# Patient Record
Sex: Male | Born: 1975 | Race: White | Hispanic: No | Marital: Married | Smoking: Never smoker
Health system: Southern US, Community
[De-identification: ages and names within clinical notes are randomized; demographics above are authoritative.]

## PROBLEM LIST (undated history)

## (undated) DIAGNOSIS — M109 Gout, unspecified: Secondary | ICD-10-CM

## (undated) HISTORY — PX: NO PAST SURGERIES: SHX2092

---

## 2008-11-20 ENCOUNTER — Emergency Department: Payer: Self-pay | Admitting: Emergency Medicine

## 2008-12-04 DIAGNOSIS — M1A9XX0 Chronic gout, unspecified, without tophus (tophi): Secondary | ICD-10-CM

## 2008-12-04 DIAGNOSIS — M1A00X0 Idiopathic chronic gout, unspecified site, without tophus (tophi): Secondary | ICD-10-CM

## 2008-12-04 HISTORY — DX: Idiopathic chronic gout, unspecified site, without tophus (tophi): M1A.00X0

## 2008-12-04 HISTORY — DX: Chronic gout, unspecified, without tophus (tophi): M1A.9XX0

## 2009-05-27 ENCOUNTER — Emergency Department: Payer: Self-pay | Admitting: Internal Medicine

## 2009-06-03 ENCOUNTER — Emergency Department: Payer: Self-pay | Admitting: Internal Medicine

## 2013-02-04 ENCOUNTER — Emergency Department: Payer: Self-pay | Admitting: Emergency Medicine

## 2013-02-14 ENCOUNTER — Emergency Department: Payer: Self-pay | Admitting: Emergency Medicine

## 2013-03-17 ENCOUNTER — Emergency Department: Payer: Self-pay | Admitting: Emergency Medicine

## 2013-03-17 LAB — BASIC METABOLIC PANEL
Anion Gap: 5 — ABNORMAL LOW (ref 7–16)
Chloride: 103 mmol/L (ref 98–107)
Creatinine: 1.27 mg/dL (ref 0.60–1.30)
EGFR (African American): >60
Glucose: 102 mg/dL — ABNORMAL HIGH (ref 65–99)
Osmolality: 276 (ref 275–301)
Potassium: 3.9 mmol/L (ref 3.5–5.1)
Sodium: 137 mmol/L (ref 136–145)

## 2013-03-17 LAB — URIC ACID: Uric Acid: 8.9 mg/dL — ABNORMAL HIGH (ref 3.5–7.2)

## 2013-03-17 LAB — CBC
HCT: 43.4 % (ref 40.0–52.0)
HGB: 15.1 g/dL (ref 13.0–18.0)
MCHC: 34.8 g/dL (ref 32.0–36.0)
RBC: 5.26 10*6/uL (ref 4.40–5.90)
RDW: 13.5 % (ref 11.5–14.5)

## 2014-03-27 DIAGNOSIS — M1009 Idiopathic gout, multiple sites: Secondary | ICD-10-CM

## 2014-03-27 HISTORY — DX: Idiopathic gout, multiple sites: M10.09

## 2014-03-27 LAB — CBC AND DIFFERENTIAL
HEMATOCRIT: 46 % (ref 41–53)
Hemoglobin: 15.5 g/dL (ref 13.5–17.5)
NEUTROS ABS: 18 /uL
Platelets: 294 10*3/uL (ref 150–399)
WBC: 20.9 10*3/mL

## 2014-03-27 LAB — BASIC METABOLIC PANEL
BUN: 21 mg/dL (ref 4–21)
Creatinine: 1.1 mg/dL (ref 0.6–1.3)
GLUCOSE: 228 mg/dL
POTASSIUM: 4.2 mmol/L (ref 3.4–5.3)
SODIUM: 145 mmol/L (ref 137–147)

## 2014-03-27 LAB — HEPATIC FUNCTION PANEL
ALT: 59 U/L — AB (ref 10–40)
AST: 27 U/L (ref 14–40)

## 2015-01-24 IMAGING — CR DG SHOULDER 3+V*R*
1 series · 3 of 3 positions shown · non-contrast
Comparison: None

REASON FOR EXAM: R shoulder pain
COMMENTS:

PROCEDURE:     DXR - DXR SHOULDER RIGHT COMPLETE  - February 04, 2013 [DATE]
RESULT:     History: Right shoulder pain

[Series 1: w shoulder external right · 0.14mm/px · 3 of 3 slices shown]
[im 1/3]
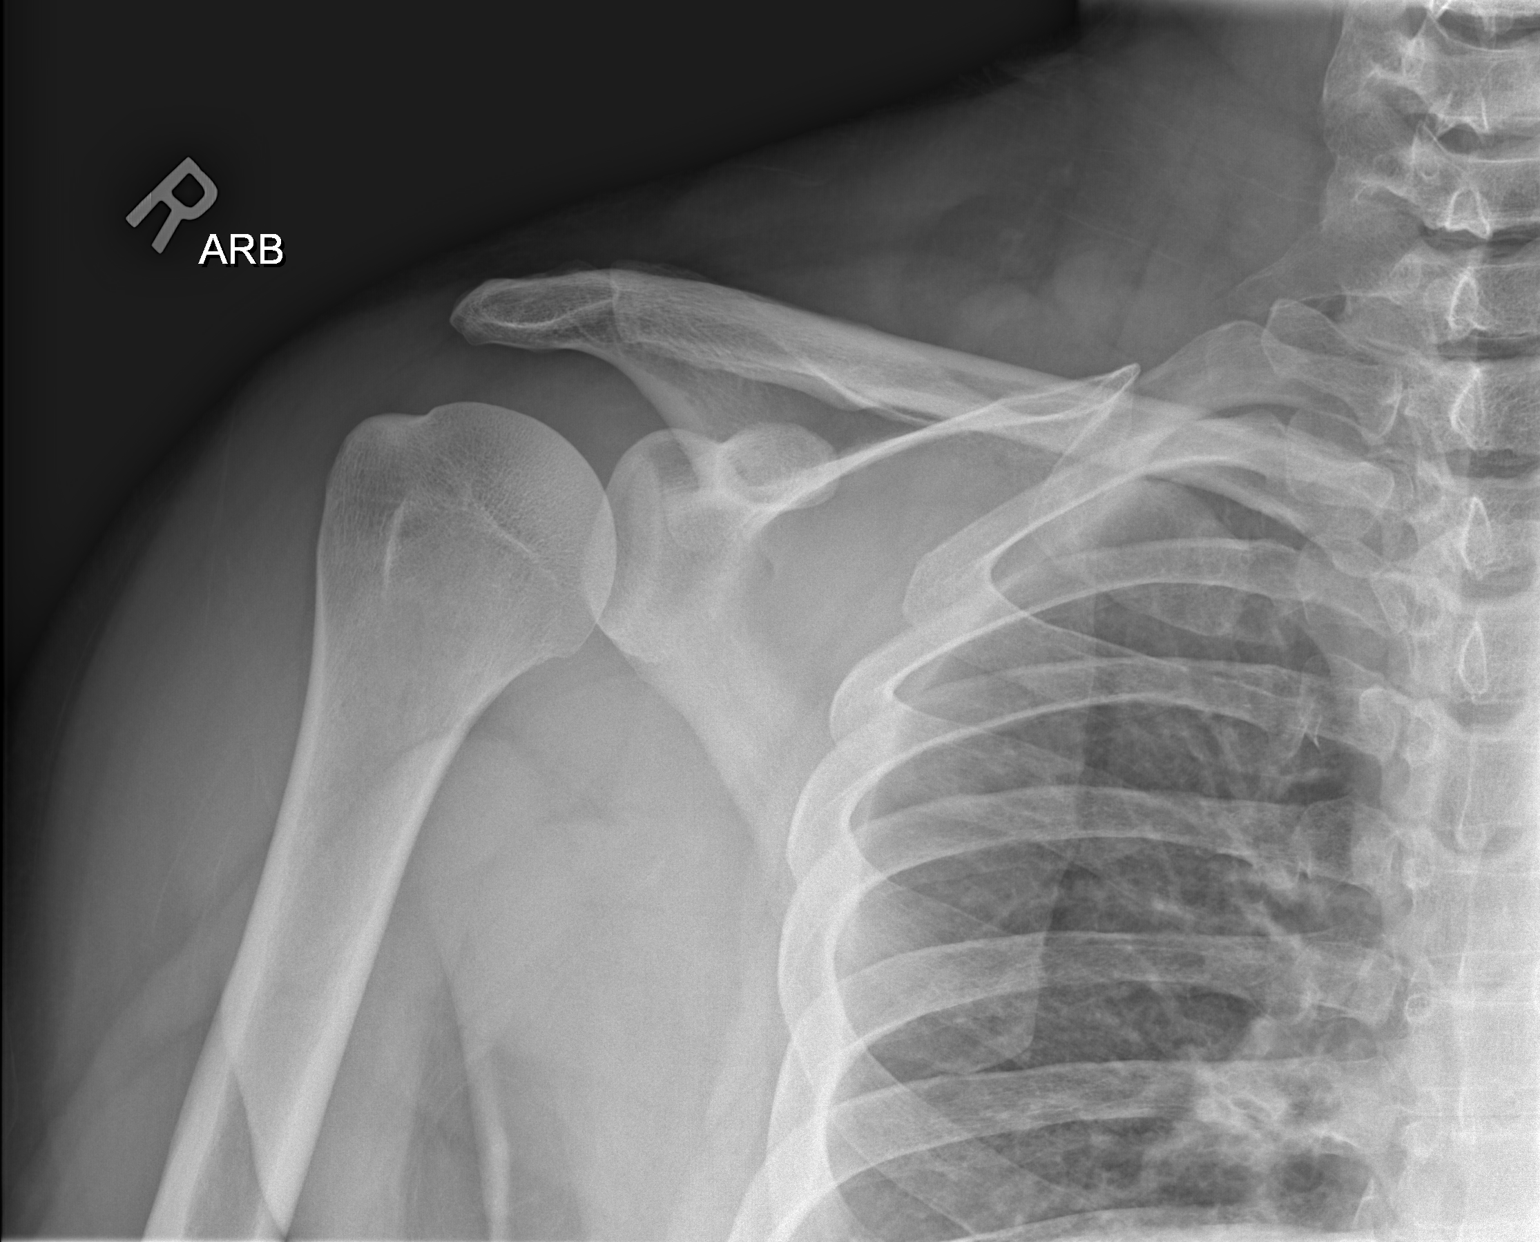
[im 2/3]
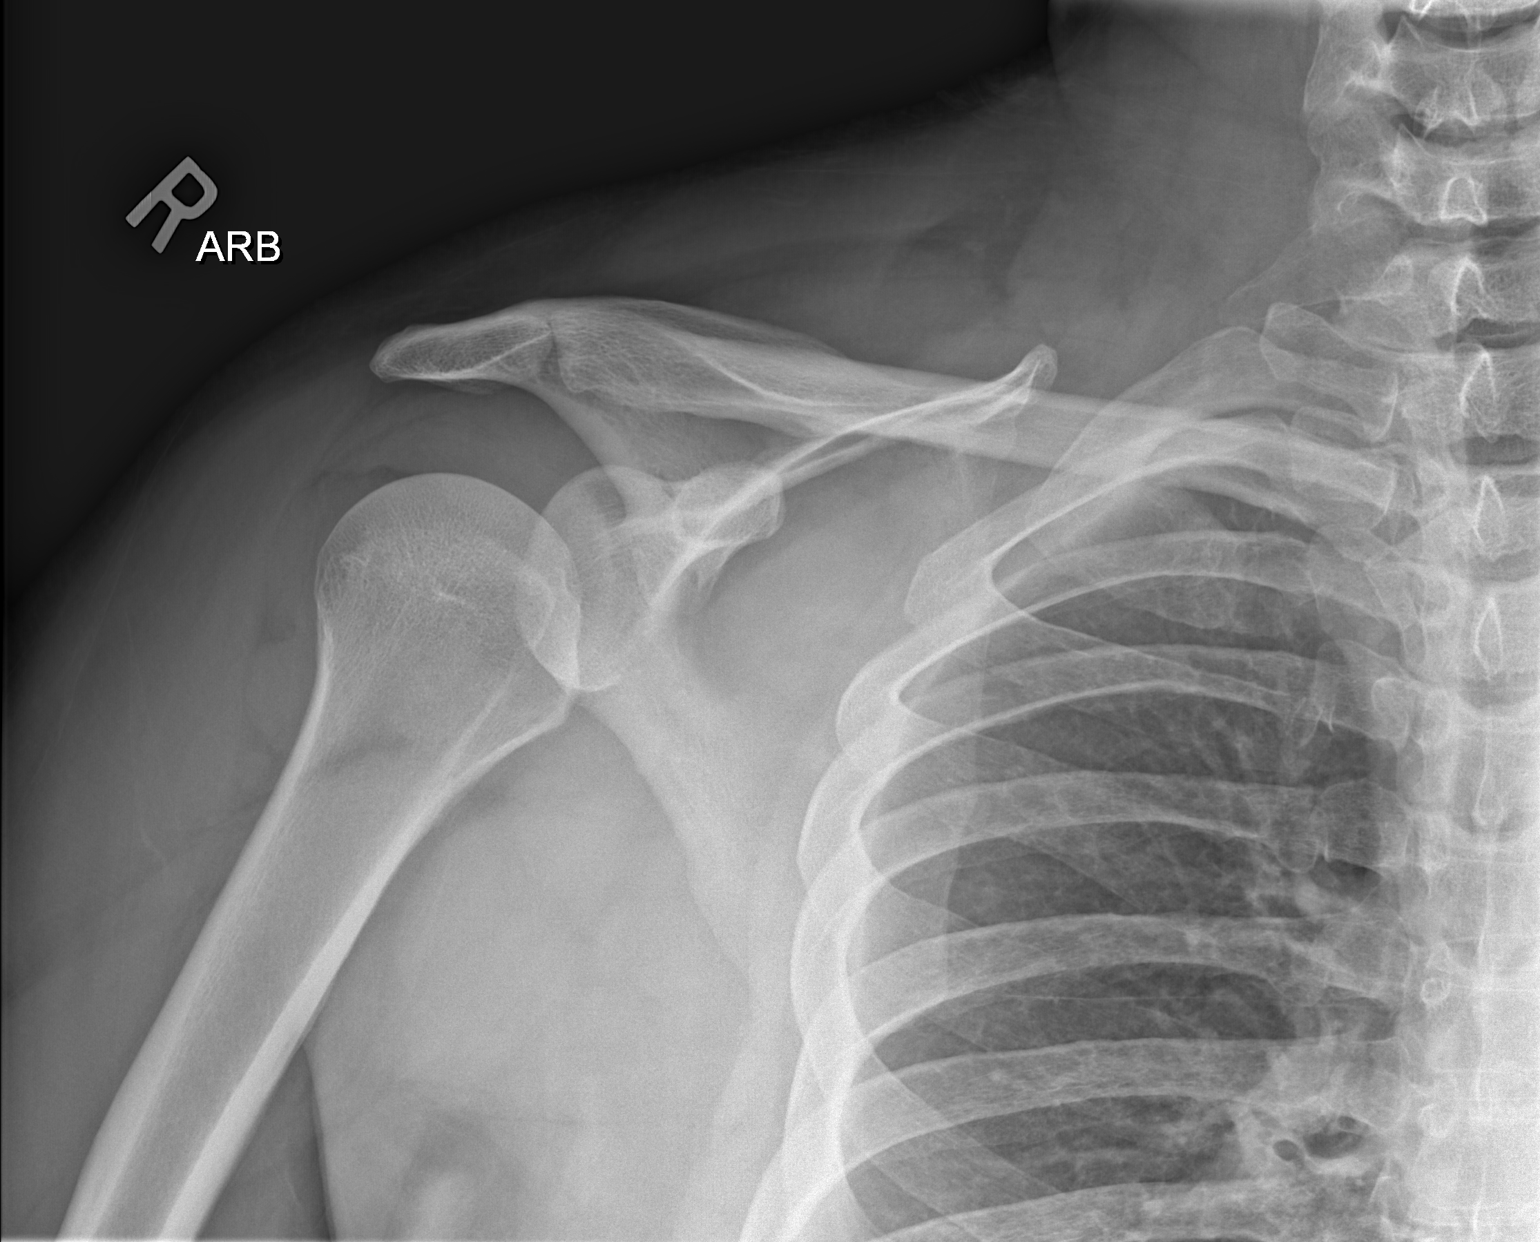
[im 3/3]
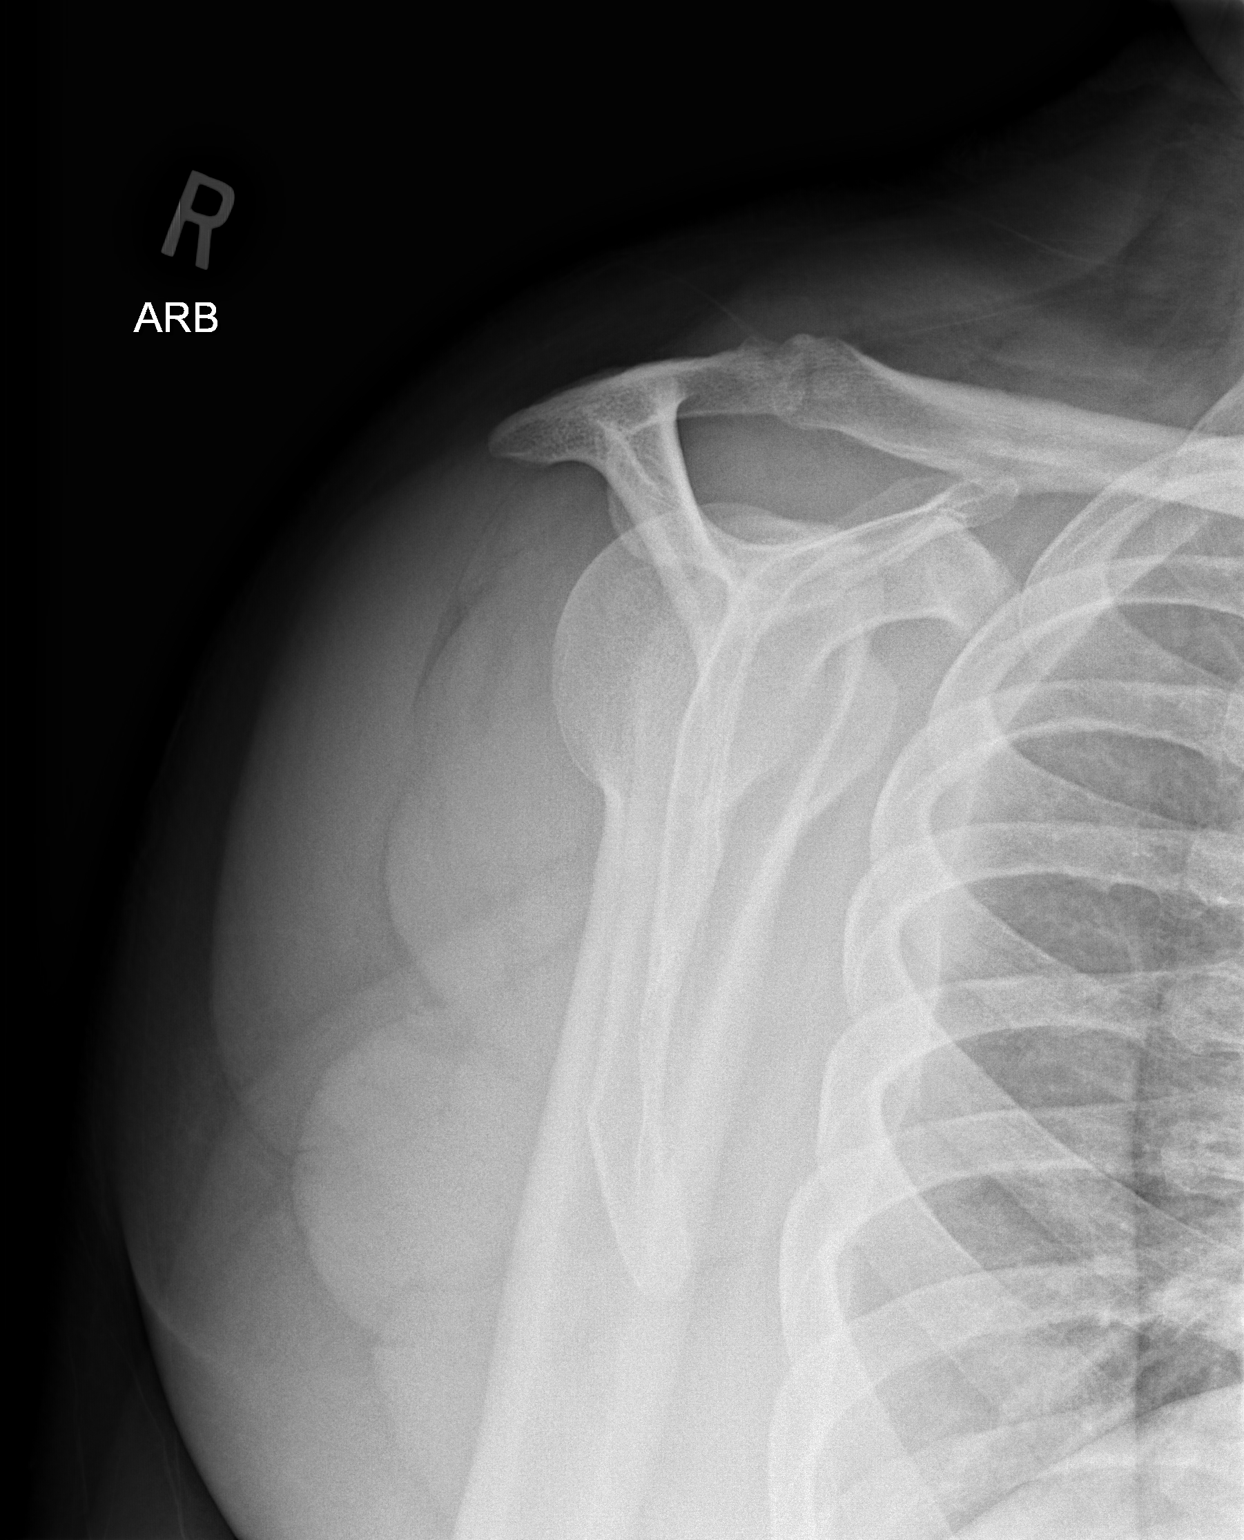

[3 of 3 positions shown; findings below may reference images not displayed]

FINDINGS: 3 views of the right shoulder demonstrates no fracture or dislocation. The
acromioclavicular joint is normal.
IMPRESSION: No acute osseous injury of the right shoulder.

[REDACTED]

## 2015-03-02 DIAGNOSIS — E668 Other obesity: Secondary | ICD-10-CM | POA: Insufficient documentation

## 2015-03-02 DIAGNOSIS — E669 Obesity, unspecified: Secondary | ICD-10-CM

## 2015-03-02 DIAGNOSIS — M109 Gout, unspecified: Secondary | ICD-10-CM | POA: Insufficient documentation

## 2015-03-02 DIAGNOSIS — K219 Gastro-esophageal reflux disease without esophagitis: Secondary | ICD-10-CM

## 2015-03-02 HISTORY — DX: Obesity, unspecified: E66.9

## 2015-03-02 HISTORY — DX: Gout, unspecified: M10.9

## 2015-03-02 HISTORY — DX: Gastro-esophageal reflux disease without esophagitis: K21.9

## 2015-11-09 ENCOUNTER — Ambulatory Visit (INDEPENDENT_AMBULATORY_CARE_PROVIDER_SITE_OTHER): Payer: Medicaid Other | Admitting: Family Medicine

## 2015-11-09 ENCOUNTER — Encounter: Payer: Self-pay | Admitting: Family Medicine

## 2015-11-09 DIAGNOSIS — G4730 Sleep apnea, unspecified: Secondary | ICD-10-CM | POA: Diagnosis not present

## 2015-11-09 DIAGNOSIS — K219 Gastro-esophageal reflux disease without esophagitis: Secondary | ICD-10-CM | POA: Diagnosis not present

## 2015-11-09 DIAGNOSIS — R609 Edema, unspecified: Secondary | ICD-10-CM | POA: Diagnosis not present

## 2015-11-09 DIAGNOSIS — M1009 Idiopathic gout, multiple sites: Secondary | ICD-10-CM

## 2015-11-09 DIAGNOSIS — M1A00X0 Idiopathic chronic gout, unspecified site, without tophus (tophi): Secondary | ICD-10-CM

## 2015-11-09 DIAGNOSIS — M109 Gout, unspecified: Secondary | ICD-10-CM

## 2015-11-09 NOTE — Progress Notes (Signed)
Patient ID: Anthony Mitchell, male   DOB: 1976-03-07, 40 y.o.   MRN: [id number]       Patient: Anthony Mitchell Male    DOB: Mar 03, 1976   39 y.o.   MRN: [id number] Visit Date: 11/09/2015  Today's Provider: Dortha Kern, PA   Chief Complaint  Patient presents with  . Gout  . Snoring  . Edema   Subjective:    HPI  Patient is here for follow up. Last visit was in April 2016. Gout: Patient has changed his diet to vegetarian diet. His symptoms have improved some. Patient was on Prednisone for 7 years at least once a month for gout but has been off of this medication for 6 months now. He has been taking Ibuprofen for the pain. He is still following rheumatologist for this issue.  Edema: patient is concerned with swelling in his left lower leg x 2 days. Patient states he pressed that area and noticed some pitting edema. Swelling does get better over night. No swelling in the right leg. Patient denies chest pain, chest tightness or shortness of breath. He does have numbness in his foot but it is chronic and had this evaluated before.  Patient wants to get referred for another sleep study. He had this done before about 10 years ago and he tried CPAP machine but could not get use to it. His symptoms are: snoring, apnea, gasping for air at night and choking on acid at times, feeling tired all the time all day long.   No past medical history on file. Patient Active Problem List   Diagnosis Date Noted  . Acid reflux 03/02/2015  . 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 Extreme obesity (HCC) 03/02/2015  . 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 No past surgeries     Family History  Problem Relation Age of Onset  . COPD Father   . Heart disease Father   . Hypertension Father   . Healthy Sister   . Gout Brother   . Heart attack Maternal Grandfather   . Gout Maternal Grandfather   . Alcohol abuse Maternal Grandfather   . Other Mother    tachycardiac    Allergies  Allergen Reactions  . Hydrocodone-Acetaminophen     itching and rash   Previous Medications   ALLOPURINOL (ZYLOPRIM) 300 MG TABLET    Take 1 tablet by mouth 2 (two) times daily.    IBUPROFEN (ADVIL,MOTRIN) 600 MG TABLET    Take 1 tablet by mouth as needed.   MULTIPLE VITAMIN PO    Take 1 tablet by mouth daily.   OMEGA-3 FATTY ACIDS (FISH OIL) 500 MG CAPS    Take by mouth.    Review of Systems  Constitutional: Positive for fatigue.  Respiratory: Positive for apnea, choking and wheezing. Negative for cough and shortness of breath.   Cardiovascular: Positive for leg swelling. Negative for chest pain and palpitations.  Musculoskeletal: Positive for myalgias, back pain, joint swelling, arthralgias and gait problem. Negative for neck pain and neck stiffness.  Neurological: Positive for light-headedness (a couple spells at night).    Social History  Substance Use Topics  . Smoking status: Never Smoker   . Smokeless tobacco: Never Used  . Alcohol Use: No   Objective:   BP 132/98 mmHg  Pulse 80  Temp(Src) 98.6 F (37 C)  Resp 18  Wt 277 lb (125.646 kg) Body mass index is 41.5 kg/(m^2).   Physical Exam  Constitutional: He is oriented to person, place, and time. He appears well-developed and well-nourished.  Morbid obesity  HENT:  Head: Normocephalic.  Left Ear: External ear normal.  Nose: Nose normal.  Mouth/Throat: Oropharynx is clear and moist.  Eyes: Conjunctivae and EOM are normal.  Neck: Normal range of motion. Neck supple.  Cardiovascular: Normal rate, regular rhythm and normal heart sounds.   Pulmonary/Chest: Effort normal and breath sounds normal.  Abdominal: Soft. Bowel sounds are normal.  Musculoskeletal: He exhibits edema.  1+ pitting edema both lower legs to mid calf.  Neurological: He is alert and oriented to person, place, and time.  Psychiatric: He has a normal mood and affect. His behavior is normal.      Assessment & Plan:       1. Morbid obesity, unspecified obesity type (HCC) BMI >41. Having chronic gouty arthritis, GERD and sleep apnea. Suspect all secondary to morbid obesity. Will check labs for metabolic disorder (i.e. - diabetes). Encouraged to get on low calorie diet (1400-1500) and try to exercise by walking or doing water aerobic exercises. - Comprehensive metabolic panel - Lipid panel - Hemoglobin A1c - TSH  2. Chronic gouty arthropathy Presently on Ibuprofen 600 mg as needed (may go up to QID) and continue Allopurinol 300 mg qd. Recheck labs. - CBC with Differential/Platelet - Uric acid  3. Arthritis urica  4. Gastroesophageal reflux disease, esophagitis presence not specified Having some nocturnal choking and wheezing. May be related to sleep apnea and GERD. May use Zantac 150 mg BID and check routine labs. Denies any swallowing difficulties. - CBC with Differential/Platelet  5. Peripheral edema Mild venous insufficiency with morbid obesity. Elevate legs when possible, limit sodium intake and may use support hose. Check labs for possible hypothyroidism. - TSH  6. Sleep apnea Epworth sleepiness scale score 23 and history of witnessed apnea, snoring and morbid obesity. Will schedule for sleep study. - Ambulatory referral to Sleep Studies       Dortha Kernennis Fernand Sorbello, PA  [facility]

## 2015-11-09 NOTE — Patient Instructions (Signed)
Sleep Apnea  Sleep apnea is a sleep disorder characterized by abnormal pauses in breathing while you sleep. When your breathing pauses, the level of oxygen in your blood decreases. This causes you to move out of deep sleep and into light sleep. As a result, your quality of sleep is poor, and the system that carries your blood throughout your body (cardiovascular system) experiences stress. If sleep apnea remains untreated, the following conditions can develop:  High blood pressure (hypertension).  Coronary artery disease.  Inability to achieve or maintain an erection (impotence).  Impairment of your thought process (cognitive dysfunction). There are three types of sleep apnea: 1. Obstructive sleep apnea--Pauses in breathing during sleep because of a blocked airway. 2. Central sleep apnea--Pauses in breathing during sleep because the area of the brain that controls your breathing does not send the correct signals to the muscles that control breathing. 3. Mixed sleep apnea--A combination of both obstructive and central sleep apnea. RISK FACTORS The following risk factors can increase your risk of developing sleep apnea:  Being overweight.  Smoking.  Having narrow passages in your nose and throat.  Being of older age.  Being male.  Alcohol use.  Sedative and tranquilizer use.  Ethnicity. Among individuals younger than 35 years, African Americans are at increased risk of sleep apnea. SYMPTOMS   Difficulty staying asleep.  Daytime sleepiness and fatigue.  Loss of energy.  Irritability.  Loud, heavy snoring.  Morning headaches.  Trouble concentrating.  Forgetfulness.  Decreased interest in sex.  Unexplained sleepiness. DIAGNOSIS  In order to diagnose sleep apnea, your caregiver will perform a physical examination. A sleep study done in the comfort of your own home may be appropriate if you are otherwise healthy. Your caregiver may also recommend that you spend the  night in a sleep lab. In the sleep lab, several monitors record information about your heart, lungs, and brain while you sleep. Your leg and arm movements and blood oxygen level are also recorded. TREATMENT The following actions may help to resolve mild sleep apnea:  Sleeping on your side.   Using a decongestant if you have nasal congestion.   Avoiding the use of depressants, including alcohol, sedatives, and narcotics.   Losing weight and modifying your diet if you are overweight. There also are devices and treatments to help open your airway:  Oral appliances. These are custom-made mouthpieces that shift your lower jaw forward and slightly open your bite. This opens your airway.  Devices that create positive airway pressure. This positive pressure "splints" your airway open to help you breathe better during sleep. The following devices create positive airway pressure:  Continuous positive airway pressure (CPAP) device. The CPAP device creates a continuous level of air pressure with an air pump. The air is delivered to your airway through a mask while you sleep. This continuous pressure keeps your airway open.  Nasal expiratory positive airway pressure (EPAP) device. The EPAP device creates positive air pressure as you exhale. The device consists of single-use valves, which are inserted into each nostril and held in place by adhesive. The valves create very little resistance when you inhale but create much more resistance when you exhale. That increased resistance creates the positive airway pressure. This positive pressure while you exhale keeps your airway open, making it easier to breath when you inhale again.  Bilevel positive airway pressure (BPAP) device. The BPAP device is used mainly in patients with central sleep apnea. This device is similar to the CPAP device because   it also uses an air pump to deliver continuous air pressure through a mask. However, with the BPAP machine, the  pressure is set at two different levels. The pressure when you exhale is lower than the pressure when you inhale.  Surgery. Typically, surgery is only done if you cannot comply with less invasive treatments or if the less invasive treatments do not improve your condition. Surgery involves removing excess tissue in your airway to create a wider passage way.   This information is not intended to replace advice given to you by your health care provider. Make sure you discuss any questions you have with your health care provider.   Document Released: 05/30/2002 Document Revised: 06/30/2014 Document Reviewed: 10/16/2011 Elsevier Interactive Patient Education 2016 Elsevier Inc. Gout Gout is an inflammatory arthritis caused by a buildup of uric acid crystals in the joints. Uric acid is a chemical that is normally present in the blood. When the level of uric acid in the blood is too high it can form crystals that deposit in your joints and tissues. This causes joint redness, soreness, and swelling (inflammation). Repeat attacks are common. Over time, uric acid crystals can form into masses (tophi) near a joint, destroying bone and causing disfigurement. Gout is treatable and often preventable. CAUSES  The disease begins with elevated levels of uric acid in the blood. Uric acid is produced by your body when it breaks down a naturally found substance called purines. Certain foods you eat, such as meats and fish, contain high amounts of purines. Causes of an elevated uric acid level include:  Being passed down from parent to child (heredity).  Diseases that cause increased uric acid production (such as obesity, psoriasis, and certain cancers).  Excessive alcohol use.  Diet, especially diets rich in meat and seafood.  Medicines, including certain cancer-fighting medicines (chemotherapy), water pills (diuretics), and aspirin.  Chronic kidney disease. The kidneys are no longer able to remove uric acid  well.  Problems with metabolism. Conditions strongly associated with gout include: 4. Obesity. 5. High blood pressure. 6. High cholesterol. 7. Diabetes. Not everyone with elevated uric acid levels gets gout. It is not understood why some people get gout and others do not. Surgery, joint injury, and eating too much of certain foods are some of the factors that can lead to gout attacks. SYMPTOMS   An attack of gout comes on quickly. It causes intense pain with redness, swelling, and warmth in a joint.  Fever can occur.  Often, only one joint is involved. Certain joints are more commonly involved:  Base of the big toe.  Knee.  Ankle.  Wrist.  Finger. Without treatment, an attack usually goes away in a few days to weeks. Between attacks, you usually will not have symptoms, which is different from many other forms of arthritis. DIAGNOSIS  Your caregiver will suspect gout based on your symptoms and exam. In some cases, tests may be recommended. The tests may include:  Blood tests.  Urine tests.  X-rays.  Joint fluid exam. This exam requires a needle to remove fluid from the joint (arthrocentesis). Using a microscope, gout is confirmed when uric acid crystals are seen in the joint fluid. TREATMENT  There are two phases to gout treatment: treating the sudden onset (acute) attack and preventing attacks (prophylaxis).  Treatment of an Acute Attack.  Medicines are used. These include anti-inflammatory medicines or steroid medicines.  An injection of steroid medicine into the affected joint is sometimes necessary.  The painful joint  is rested. Movement can worsen the arthritis.  You may use warm or cold treatments on painful joints, depending which works best for you.  Treatment to Prevent Attacks.  If you suffer from frequent gout attacks, your caregiver may advise preventive medicine. These medicines are started after the acute attack subsides. These medicines either help  your kidneys eliminate uric acid from your body or decrease your uric acid production. You may need to stay on these medicines for a very long time.  The early phase of treatment with preventive medicine can be associated with an increase in acute gout attacks. For this reason, during the first few months of treatment, your caregiver may also advise you to take medicines usually used for acute gout treatment. Be sure you understand your caregiver's directions. Your caregiver may make several adjustments to your medicine dose before these medicines are effective.  Discuss dietary treatment with your caregiver or dietitian. Alcohol and drinks high in sugar and fructose and foods such as meat, poultry, and seafood can increase uric acid levels. Your caregiver or dietitian can advise you on drinks and foods that should be limited. HOME CARE INSTRUCTIONS   Do not take aspirin to relieve pain. This raises uric acid levels.  Only take over-the-counter or prescription medicines for pain, discomfort, or fever as directed by your caregiver.  Rest the joint as much as possible. When in bed, keep sheets and blankets off painful areas.  Keep the affected joint raised (elevated).  Apply warm or cold treatments to painful joints. Use of warm or cold treatments depends on which works best for you.  Use crutches if the painful joint is in your leg.  Drink enough fluids to keep your urine clear or pale yellow. This helps your body get rid of uric acid. Limit alcohol, sugary drinks, and fructose drinks.  Follow your dietary instructions. Pay careful attention to the amount of protein you eat. Your daily diet should emphasize fruits, vegetables, whole grains, and fat-free or low-fat milk products. Discuss the use of coffee, vitamin C, and cherries with your caregiver or dietitian. These may be helpful in lowering uric acid levels.  Maintain a healthy body weight. SEEK MEDICAL CARE IF:   You develop diarrhea,  vomiting, or any side effects from medicines.  You do not feel better in 24 hours, or you are getting worse. SEEK IMMEDIATE MEDICAL CARE IF:   Your joint becomes suddenly more tender, and you have chills or a fever. MAKE SURE YOU:   Understand these instructions.  Will watch your condition.  Will get help right away if you are not doing well or get worse.   This information is not intended to replace advice given to you by your health care provider. Make sure you discuss any questions you have with your health care provider.   Document Released: 06/06/2000 Document Revised: 06/30/2014 Document Reviewed: 01/21/2012 Elsevier Interactive Patient Education [employer] Elsevier Inc.

## 2015-11-10 LAB — CBC WITH DIFFERENTIAL/PLATELET
BASOS ABS: 0.1 10*3/uL (ref 0.0–0.2)
Basos: 1 %
EOS (ABSOLUTE): 0.1 10*3/uL (ref 0.0–0.4)
Eos: 1 %
HEMOGLOBIN: 15.6 g/dL (ref 12.6–17.7)
Hematocrit: 46.5 % (ref 37.5–51.0)
Immature Grans (Abs): 0 10*3/uL (ref 0.0–0.1)
Immature Granulocytes: 0 %
LYMPHS ABS: 2 10*3/uL (ref 0.7–3.1)
Lymphs: 21 %
MCH: 28.6 pg (ref 26.6–33.0)
MCHC: 33.5 g/dL (ref 31.5–35.7)
MCV: 85 fL (ref 79–97)
MONOS ABS: 1 10*3/uL — AB (ref 0.1–0.9)
Monocytes: 10 %
NEUTROS ABS: 6.4 10*3/uL (ref 1.4–7.0)
Neutrophils: 67 %
PLATELETS: 233 10*3/uL (ref 150–379)
RBC: 5.46 x10E6/uL (ref 4.14–5.80)
RDW: 15.2 % (ref 12.3–15.4)
WBC: 9.6 10*3/uL (ref 3.4–10.8)

## 2015-11-10 LAB — COMPREHENSIVE METABOLIC PANEL
ALBUMIN: 4.6 g/dL (ref 3.5–5.5)
ALK PHOS: 90 IU/L (ref 39–117)
ALT: 33 IU/L (ref 0–44)
AST: 21 IU/L (ref 0–40)
Albumin/Globulin Ratio: 1.8 (ref 1.2–2.2)
BILIRUBIN TOTAL: 0.8 mg/dL (ref 0.0–1.2)
BUN / CREAT RATIO: 11 (ref 9–20)
BUN: 12 mg/dL (ref 6–20)
CHLORIDE: 96 mmol/L (ref 96–106)
CO2: 26 mmol/L (ref 18–29)
Calcium: 10 mg/dL (ref 8.7–10.2)
Creatinine, Ser: 1.11 mg/dL (ref 0.76–1.27)
GFR calc Af Amer: 97 mL/min/{1.73_m2} (ref >59)
GFR calc non Af Amer: 84 mL/min/{1.73_m2} (ref >59)
GLUCOSE: 141 mg/dL — AB (ref 65–99)
Globulin, Total: 2.6 g/dL (ref 1.5–4.5)
Potassium: 4.6 mmol/L (ref 3.5–5.2)
Sodium: 141 mmol/L (ref 134–144)
Total Protein: 7.2 g/dL (ref 6.0–8.5)

## 2015-11-10 LAB — LIPID PANEL
CHOLESTEROL TOTAL: 205 mg/dL — AB (ref 100–199)
Chol/HDL Ratio: 5.5 ratio units — ABNORMAL HIGH (ref 0.0–5.0)
HDL: 37 mg/dL — ABNORMAL LOW (ref >39)
LDL Calculated: 123 mg/dL — ABNORMAL HIGH (ref 0–99)
Triglycerides: 224 mg/dL — ABNORMAL HIGH (ref 0–149)
VLDL CHOLESTEROL CAL: 45 mg/dL — AB (ref 5–40)

## 2015-11-10 LAB — HEMOGLOBIN A1C
ESTIMATED AVERAGE GLUCOSE: 151 mg/dL
Hgb A1c MFr Bld: 6.9 % — ABNORMAL HIGH (ref 4.8–5.6)

## 2015-11-10 LAB — URIC ACID: Uric Acid: 6.4 mg/dL (ref 3.7–8.6)

## 2015-11-10 LAB — TSH: TSH: 2.54 u[IU]/mL (ref 0.450–4.500)

## 2015-11-12 ENCOUNTER — Telehealth: Payer: Self-pay

## 2015-11-12 ENCOUNTER — Telehealth: Payer: Self-pay | Admitting: Family Medicine

## 2015-11-12 MED ORDER — METFORMIN HCL 500 MG PO TABS: 500.0000 mg | ORAL_TABLET | Freq: Every day | ORAL | Status: DC

## 2015-11-12 MED ORDER — PRAVASTATIN SODIUM 20 MG PO TABS: 20.0000 mg | ORAL_TABLET | Freq: Every day | ORAL | Status: AC

## 2015-11-12 NOTE — Telephone Encounter (Signed)
-----   Message from Tamsen Roersennis E Chrismon, [place] sent at 11/11/2015 11:33 PM EDT ----- Cholesterol and triglycerides high. Blood sugar high and Hgb A1C at diabetic levels. Work on weight loss and getting sleep study done will be helpful. Need Pravastatin 20 mg qd #30 & 3 RF with Metformin 500 mg 1 qd #30 & 3 RF. Recheck progress and blood tests in 3 months.

## 2015-11-12 NOTE — Telephone Encounter (Signed)
Pt is returning call.  CB#[phone]/MW

## 2015-11-12 NOTE — Telephone Encounter (Signed)
See lab results note-aa

## 2015-11-12 NOTE — Telephone Encounter (Signed)
Pt advised, RXs sent in-aa

## 2015-11-21 ENCOUNTER — Ambulatory Visit: Payer: Medicaid Other | Attending: Family Medicine

## 2015-11-21 DIAGNOSIS — G4761 Periodic limb movement disorder: Secondary | ICD-10-CM | POA: Insufficient documentation

## 2015-11-21 DIAGNOSIS — I10 Essential (primary) hypertension: Secondary | ICD-10-CM | POA: Diagnosis present

## 2015-11-21 DIAGNOSIS — G4733 Obstructive sleep apnea (adult) (pediatric): Secondary | ICD-10-CM | POA: Insufficient documentation

## 2015-11-21 DIAGNOSIS — R0683 Snoring: Secondary | ICD-10-CM | POA: Insufficient documentation

## 2015-12-06 ENCOUNTER — Encounter: Payer: Self-pay | Admitting: Family Medicine

## 2015-12-14 ENCOUNTER — Encounter: Payer: Self-pay | Admitting: Family Medicine

## 2015-12-14 ENCOUNTER — Ambulatory Visit (INDEPENDENT_AMBULATORY_CARE_PROVIDER_SITE_OTHER): Payer: Medicaid Other | Admitting: Family Medicine

## 2015-12-14 VITALS — BP 118/70 | HR 80 | Temp 98.4°F | Resp 16 | Wt 265.0 lb

## 2015-12-14 DIAGNOSIS — G4733 Obstructive sleep apnea (adult) (pediatric): Secondary | ICD-10-CM | POA: Diagnosis not present

## 2015-12-14 DIAGNOSIS — E785 Hyperlipidemia, unspecified: Secondary | ICD-10-CM

## 2015-12-14 DIAGNOSIS — E119 Type 2 diabetes mellitus without complications: Secondary | ICD-10-CM

## 2015-12-14 DIAGNOSIS — Z23 Encounter for immunization: Secondary | ICD-10-CM | POA: Diagnosis not present

## 2015-12-14 HISTORY — DX: Hyperlipidemia, unspecified: E78.5

## 2015-12-14 HISTORY — DX: Type 2 diabetes mellitus without complications: E11.9

## 2015-12-14 HISTORY — DX: Obstructive sleep apnea (adult) (pediatric): G47.33

## 2015-12-14 NOTE — Progress Notes (Signed)
Patient: Anthony Mitchell Male    DOB: 07/20/1975   40 y.o.   MRN: [id number] Visit Date: 12/14/2015  Today's Provider: Dortha Kernennis Chrismon, PA   Chief Complaint  Patient presents with  . Follow-up   Subjective:    Hyperlipidemia Exacerbating diseases include diabetes. Pertinent negatives include no chest pain, focal sensory loss, focal weakness, leg pain, myalgias or shortness of breath. Current antihyperlipidemic treatment includes statins. There are no compliance problems.   Diabetes He has type 2 diabetes mellitus. His disease course has been stable. Pertinent negatives for hypoglycemia include no dizziness, headaches or nervousness/anxiousness. Pertinent negatives for diabetes include no chest pain, no fatigue, no foot paresthesias, no polydipsia, no polyphagia and no visual change. There are no hypoglycemic complications. There are no diabetic complications.   No past medical history on file. Patient Active Problem List   Diagnosis Date Noted  . Acid reflux 03/02/2015  . 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 Extreme obesity (HCC) 03/02/2015  . 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 No past surgeries     Family History  Problem Relation Age of Onset  . COPD Father   . Heart disease Father   . Hypertension Father   . Healthy Sister   . Gout Brother   . Heart attack Maternal Grandfather   . Gout Maternal Grandfather   . Alcohol abuse Maternal Grandfather   . Other Mother     tachycardiac    Allergies  Allergen Reactions  . Hydrocodone-Acetaminophen     itching and rash   Current Meds  Medication Sig  . allopurinol (ZYLOPRIM) 300 MG tablet Take 1 tablet by mouth 2 (two) times daily.   Marland Kitchen. ibuprofen (ADVIL,MOTRIN) 600 MG tablet Take 1 tablet by mouth as needed.  . metFORMIN (GLUCOPHAGE) 500 MG tablet Take 1 tablet (500 mg total) by mouth daily with breakfast.  . MULTIPLE VITAMIN PO Take 1 tablet by mouth daily.  .  Omega-3 Fatty Acids (FISH OIL) 500 MG CAPS Take by mouth.  . pravastatin (PRAVACHOL) 20 MG tablet Take 1 tablet (20 mg total) by mouth daily.    Review of Systems  Constitutional: Negative for fatigue.  Respiratory: Negative for shortness of breath.   Cardiovascular: Negative for chest pain.  Endocrine: Negative for polydipsia and polyphagia.  Musculoskeletal: Negative for myalgias.  Neurological: Negative for dizziness, focal weakness and headaches.  Psychiatric/Behavioral: The patient is not nervous/anxious.     Social History  Substance Use Topics  . Smoking status: Never Smoker   . Smokeless tobacco: Never Used  . Alcohol Use: No   Objective:   BP 118/70 mmHg  Pulse 80  Temp(Src) 98.4 F (36.9 C)  Resp 16  Wt 265 lb (120.203 kg) Body mass index is 39.7 kg/(m^2).  Wt Readings from Last 3 Encounters:  12/14/15 265 lb (120.203 kg)  11/09/15 277 lb (125.646 kg)  10/06/14 268 lb (121.564 kg)    Physical Exam  Constitutional: He is oriented to person, place, and time. He appears well-developed and well-nourished.  HENT:  Head: Normocephalic.  Eyes: Conjunctivae are normal.  Neck: Neck supple.  Cardiovascular: Normal rate and regular rhythm.   Pulmonary/Chest: Effort normal and breath sounds normal.  Abdominal: Soft. Bowel sounds are normal.  Musculoskeletal: He exhibits no edema.  Neurological: He is alert and oriented to person, place, and time.      Assessment & Plan:     1. Type 2  diabetes mellitus without complication, without long-term current use of insulin (HCC) Feeling better on the Metformin with exercise and diet. Has lost 12 lbs in the past month. Continue present regimen and recheck Hgb A1C in 2 months. States FBS around 100 - 120. Lab Results  Component Value Date   HGBA1C 6.9* 11/09/2015   2. Hyperlipidemia Tolerating Pravastatin 20 mg qd and lost weight with low fat diet. No GI upset or myalgias.  Lab Results  Component Value Date   CHOL 205*  11/09/2015   HDL 37* 11/09/2015   LDLCALC 123* 11/09/2015   TRIG 224* 11/09/2015   CHOLHDL 5.5* 11/09/2015    3. Obstructive sleep apnea Sleep study showed severe sleep apnea and needed BiPAP. In the process of getting the machine at home through [facility]. States he slept much better using the machine during the titration study.  4. Morbid obesity, unspecified obesity type (HCC) Has lost 12 lbs and no further edema of lower extremities. No dyspnea and feeling much better. BMI went from 41 down to 39. Continue present diet and exercise program. Recheck in 2 months.  5. Need for tetanus booster Given Td booster today. Last Tdap was 12-12-2005.     Dortha Kernennis Chrismon, PA  [facility]

## 2016-02-13 ENCOUNTER — Telehealth: Payer: Self-pay | Admitting: Family Medicine

## 2016-02-13 NOTE — Telephone Encounter (Signed)
This a is [place] rights [place] pt. Pt stated he is having an acute gout flare up and is in need of an RX for Prednisone sent to [facility] [address]. Pt stated that he usually gets this from his rheumatologist but he hasn't heard back from their office and he has to work 12 hour shifts tomorrow and Friday. Pt was advised Maurine MinisterDennis is out of the office this week. Please advise. Thanks TNP

## 2016-02-13 NOTE — Telephone Encounter (Signed)
Follows up with Anthony Mitchell regularly. Would you agree to refill this? Anthony Mitchell, CMA

## 2016-02-19 NOTE — Telephone Encounter (Signed)
Please review-aa 

## 2016-02-19 NOTE — Telephone Encounter (Signed)
[place]

## 2016-02-20 NOTE — Telephone Encounter (Signed)
Please advise, I was just helping with routing messages, thank you-aa

## 2016-02-21 NOTE — Telephone Encounter (Signed)
Patient received Prednisone RX from rheumatologist.

## 2016-05-19 ENCOUNTER — Telehealth: Payer: Self-pay | Admitting: Family Medicine

## 2016-05-19 DIAGNOSIS — M1A00X0 Idiopathic chronic gout, unspecified site, without tophus (tophi): Secondary | ICD-10-CM

## 2016-05-19 NOTE — Telephone Encounter (Signed)
[place] [place] [facility] since she is closer to home

## 2016-05-19 NOTE — Telephone Encounter (Signed)
Please review. KW 

## 2016-05-20 NOTE — Telephone Encounter (Signed)
Referral placed in EPIC 

## 2016-05-20 NOTE — Telephone Encounter (Signed)
Yes

## 2016-05-21 ENCOUNTER — Ambulatory Visit: Payer: Medicaid Other | Admitting: Physician Assistant

## 2016-06-09 ENCOUNTER — Encounter: Payer: Self-pay | Admitting: Emergency Medicine

## 2016-06-09 ENCOUNTER — Emergency Department
Admission: EM | Admit: 2016-06-09 | Discharge: 2016-06-09 | Disposition: A | Discharge status: Home / Self Care | Payer: Medicaid Other | Attending: Emergency Medicine | Admitting: Emergency Medicine

## 2016-06-09 DIAGNOSIS — M1A09X0 Idiopathic chronic gout, multiple sites, without tophus (tophi): Secondary | ICD-10-CM | POA: Insufficient documentation

## 2016-06-09 DIAGNOSIS — Z7984 Long term (current) use of oral hypoglycemic drugs: Secondary | ICD-10-CM | POA: Insufficient documentation

## 2016-06-09 DIAGNOSIS — Z79899 Other long term (current) drug therapy: Secondary | ICD-10-CM | POA: Insufficient documentation

## 2016-06-09 DIAGNOSIS — E119 Type 2 diabetes mellitus without complications: Secondary | ICD-10-CM | POA: Insufficient documentation

## 2016-06-09 HISTORY — DX: Gout, unspecified: M10.9

## 2016-06-09 MED ORDER — OXYCODONE-ACETAMINOPHEN 5-325 MG PO TABS
ORAL_TABLET | ORAL | Status: AC
  Filled 2016-06-09: qty 1

## 2016-06-09 MED ORDER — ALLOPURINOL 300 MG PO TABS: 600.0000 mg | ORAL_TABLET | Freq: Every day | ORAL | 1 refills | Status: AC

## 2016-06-09 MED ORDER — OXYCODONE-ACETAMINOPHEN 5-325 MG PO TABS
1.0000 | ORAL_TABLET | Freq: Once | ORAL | Status: AC
  Administered 2016-06-09: 1 via ORAL

## 2016-06-09 MED ORDER — PREDNISONE 20 MG PO TABS
ORAL_TABLET | ORAL | Status: AC
  Filled 2016-06-09: qty 3

## 2016-06-09 MED ORDER — PREDNISONE 20 MG PO TABS
60.0000 mg | ORAL_TABLET | Freq: Once | ORAL | Status: AC
  Administered 2016-06-09: 60 mg via ORAL

## 2016-06-09 MED ORDER — PREDNISONE 10 MG PO TABS: 10.0000 mg | ORAL_TABLET | Freq: Every day | ORAL | 0 refills | Status: AC

## 2016-06-09 MED ORDER — OXYCODONE-ACETAMINOPHEN 5-325 MG PO TABS: 1.0000 | ORAL_TABLET | Freq: Four times a day (QID) | ORAL | 0 refills | Status: AC | PRN

## 2016-06-09 NOTE — ED Notes (Signed)
Patient has been off allopurinol and prednisone for one month.

## 2016-06-09 NOTE — ED Triage Notes (Signed)
States L ankle, R knee and R elbow pain for month, states is gout flare up.

## 2016-06-09 NOTE — ED Notes (Addendum)
E-signature pad not working. Patient denies having any questions or concerns from discharge. Patient is being picked up by his wife.

## 2016-06-09 NOTE — ED Provider Notes (Signed)
[facility] Emergency Department Provider Note  ____________________________________________  Time seen: Approximately 8:12 PM  I have reviewed the triage vital signs and the nursing notes.   HISTORY  Chief Complaint Gout    HPI Anthony Agresteimothy C Ferrell is a 40 y.o. male who presents emergency department complaining of flare in his scalp. Patient states that he typically has gout and bilateral ankles, knees, elbows. Patient has been followed by rheumatology for the last 10 years. Patient states that recently he had to switch to another provider within the practice. At providers first appointment was greater than 10 months out and she would not refill his allopurinol or prednisone until he was evaluated by her. Patient reports that he now has increased symptoms in his scalp. He states that he daily took 600 mg of allopurinol and told a 12 day taper of prednisone for flares. Patient is requesting same. He denies any other complaints at this time. Patient reports that he is establishing with a new provider in town and that appointment is on January 9.   Past Medical History:  Diagnosis Date  . Gout     Patient Active Problem List   Diagnosis Date Noted  . Type 2 diabetes mellitus (HCC) 12/14/2015  . Hyperlipidemia 12/14/2015  . Obstructive sleep apnea 12/14/2015  . Acid reflux 03/02/2015  . 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 Extreme obesity (HCC) 03/02/2015  . 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 NO PAST SURGERIES      Prior to Admission medications   Medication Sig Start Date End Date Taking? Authorizing Provider  allopurinol (ZYLOPRIM) 300 MG tablet Take 2 tablets (600 mg total) by mouth daily. 06/09/16   Delorise RoyalsJonathan D Cuthriell, PA-C  ibuprofen (ADVIL,MOTRIN) 600 MG tablet Take 1 tablet by mouth as needed.    Historical Provider, MD  metFORMIN (GLUCOPHAGE) 500 MG tablet Take 1 tablet (500 mg total) by mouth  daily with breakfast. 11/12/15   Jodell Ciproennis E Chrismon, PA  MULTIPLE VITAMIN PO Take 1 tablet by mouth daily.    Historical Provider, MD  Omega-3 Fatty Acids (FISH OIL) 500 MG CAPS Take by mouth.    Historical Provider, MD  oxyCODONE-acetaminophen (ROXICET) 5-325 MG tablet Take 1 tablet by mouth every 6 (six) hours as needed for severe pain. 06/09/16   Delorise RoyalsJonathan D Cuthriell, PA-C  pravastatin (PRAVACHOL) 20 MG tablet Take 1 tablet (20 mg total) by mouth daily. 11/12/15   Jodell Ciproennis E Chrismon, PA  predniSONE (DELTASONE) 10 MG tablet Take 1 tablet (10 mg total) by mouth daily. 06/09/16   Delorise RoyalsJonathan D Cuthriell, PA-C    Allergies Hydrocodone-acetaminophen  Family History  Problem Relation Age of Onset  . COPD Father   . Heart disease Father   . Hypertension Father   . Healthy Sister   . Gout Brother   . Heart attack Maternal Grandfather   . Gout Maternal Grandfather   . Alcohol abuse Maternal Grandfather   . Other Mother     tachycardiac    Social History Social History  Substance Use Topics  . Smoking status: Never Smoker  . Smokeless tobacco: Never Used  . Alcohol use No     Review of Systems  Constitutional: No fever/chills Cardiovascular: no chest pain. Respiratory: no cough. No SOB. Musculoskeletal: Positive for pain to the left ankle, right knee, right elbow Skin: Negative for rash, abrasions, lacerations, ecchymosis. Neurological: Negative for headaches, focal weakness or numbness. 10-point ROS otherwise negative.  ____________________________________________   PHYSICAL EXAM:  VITAL SIGNS: ED Triage Vitals  Enc Vitals Group     BP 06/09/16 1802 (!) 165/96     Pulse Rate 06/09/16 1802 100     Resp 06/09/16 1802 18     Temp 06/09/16 1802 98.6 F (37 C)     Temp Source 06/09/16 1802 Oral     SpO2 06/09/16 1802 98 %     Weight 06/09/16 1803 260 lb (117.9 kg)     Height 06/09/16 1803 5\' 9"  (1.753 m)     Head Circumference --      Peak Flow --      Pain Score 06/09/16  1803 10     Pain Loc --      Pain Edu? --      Excl. in GC? --      Constitutional: Alert and oriented. Well appearing and in no acute distress. Eyes: Conjunctivae are normal. PERRL. EOMI. Head: Atraumatic. Neck: No stridor.    Cardiovascular: Normal rate, regular rhythm. Normal S1 and S2.  Good peripheral circulation. Respiratory: Normal respiratory effort without tachypnea or retractions. Lungs CTAB. Good air entry to the bases with no decreased or absent breath sounds. Musculoskeletal: Full range of motion to all extremities. No gross deformities appreciated.Patient has mild edema to right elbow. Full range of motion. Mild warmth to palpation. No erythema. Patient is mildly tender to palpation to the posterior elbow. Radial pulse and sensation intact distally. Examination of the right knee reveals no edema, deformity, warmth. Full range of motion. Varus, valgus, Lachman's, McMurray's is negative. So since sensation intact distally. Examination of the left ankle reveals mild posterior edema. Mild tenderness to palpation. No palpable abnormality. Full range of motion to the ankle. Dorsalis pedis pulse intact. Sensation intact 5 digits. Neurologic:  Normal speech and language. No gross focal neurologic deficits are appreciated.  Skin:  Skin is warm, dry and intact. No rash noted. Psychiatric: Mood and affect are normal. Speech and behavior are normal. Patient exhibits appropriate insight and judgement.   ____________________________________________   LABS (all labs ordered are listed, but only abnormal results are displayed)  Labs Reviewed - No data to display ____________________________________________  EKG   ____________________________________________  RADIOLOGY   No results found.  ____________________________________________    PROCEDURES  Procedure(s) performed:    Procedures    Medications - No data to  display   ____________________________________________   INITIAL IMPRESSION / ASSESSMENT AND PLAN / ED COURSE  Pertinent labs & imaging results that were available during my care of the patient were reviewed by me and considered in my medical decision making (see chart for details).  Review of the [place] CSRS was performed in accordance of the [facility] prior to dispensing any controlled drugs.  Clinical Course     Patient's diagnosis is consistent with Gout flare. Patient has known history of reoccurring gout. Patient was out of his daily medications and was unable to refill medications due to switching endocrinologist. Patient is typically on 600 mg of allopurinol daily. Patient has symptoms consistent with gout flare. Patient declines uric acid testing and imaging at this time. At this time, with symptoms being consistent with previous diagnosis and patient being off daily medications, it is felt that this is gout and imaging and labs are deemed unnecessary at this time.. Patient will be discharged home with prescriptions for continued allopurinol, prednisone taper, limited pain medication.. Patient is to follow up with endocrinology as needed or otherwise directed. Patient is  given ED precautions to return to the ED for any worsening or new symptoms.     ____________________________________________  FINAL CLINICAL IMPRESSION(S) / ED DIAGNOSES  Final diagnoses:  Chronic gout of multiple sites, unspecified cause      NEW MEDICATIONS STARTED DURING THIS VISIT:  New Prescriptions   ALLOPURINOL (ZYLOPRIM) 300 MG TABLET    Take 2 tablets (600 mg total) by mouth daily.   OXYCODONE-ACETAMINOPHEN (ROXICET) 5-325 MG TABLET    Take 1 tablet by mouth every 6 (six) hours as needed for severe pain.   PREDNISONE (DELTASONE) 10 MG TABLET    Take 1 tablet (10 mg total) by mouth daily.        This chart was dictated using voice recognition software/Dragon. Despite best efforts to proofread,  errors can occur which can change the meaning. Any change was purely unintentional.    Racheal Patches, PA-C 06/09/16 2114    Sharman Cheek, MD 06/10/16 2242

## 2016-06-19 ENCOUNTER — Other Ambulatory Visit: Payer: Self-pay

## 2016-06-26 ENCOUNTER — Ambulatory Visit: Payer: Medicaid Other | Admitting: Family Medicine

## 2016-06-26 NOTE — Progress Notes (Deleted)
   Patient: Anthony Mitchell Male    DOB: 1975/10/29   41 y.o.   MRN: [id number] Visit Date: 06/26/2016  Today's Provider: Dortha Kernennis Chrismon, PA   No chief complaint on file.  Subjective:    HPI Patient is here today to follow up for Sleep Med therapy services.    Previous Medications   ALLOPURINOL (ZYLOPRIM) 300 MG TABLET    Take 2 tablets (600 mg total) by mouth daily.   COLCHICINE 0.6 MG TABLET    Take 0.6 mg by mouth.   IBUPROFEN (ADVIL,MOTRIN) 600 MG TABLET    Take 1 tablet by mouth as needed.   METFORMIN (GLUCOPHAGE) 500 MG TABLET    Take 1 tablet (500 mg total) by mouth daily with breakfast.   MULTIPLE VITAMIN PO    Take 1 tablet by mouth daily.   OMEGA-3 FATTY ACIDS (FISH OIL) 500 MG CAPS    Take by mouth.   OXYCODONE-ACETAMINOPHEN (ROXICET) 5-325 MG TABLET    Take 1 tablet by mouth every 6 (six) hours as needed for severe pain.   PRAVASTATIN (PRAVACHOL) 20 MG TABLET    Take 1 tablet (20 mg total) by mouth daily.   PREDNISONE (DELTASONE) 10 MG TABLET    Take 1 tablet (10 mg total) by mouth daily.    Review of Systems  Constitutional: Negative.   Respiratory: Positive for apnea.   Cardiovascular: Negative.     Social History  Substance Use Topics  . Smoking status: Never Smoker  . Smokeless tobacco: Never Used  . Alcohol use No   Objective:   There were no vitals taken for this visit.  Physical Exam      Assessment & Plan:       Follow up: No Follow-up on file.

## 2016-07-11 ENCOUNTER — Ambulatory Visit: Payer: Medicaid Other | Admitting: Family Medicine

## 2016-07-18 ENCOUNTER — Ambulatory Visit
Admission: RE | Admit: 2016-07-18 | Discharge: 2016-07-18 | Disposition: A | Discharge status: Home / Self Care | Payer: BLUE CROSS/BLUE SHIELD | Source: Ambulatory Visit | Attending: Family Medicine | Admitting: Family Medicine

## 2016-07-18 ENCOUNTER — Ambulatory Visit (INDEPENDENT_AMBULATORY_CARE_PROVIDER_SITE_OTHER): Payer: BLUE CROSS/BLUE SHIELD | Admitting: Family Medicine

## 2016-07-18 ENCOUNTER — Telehealth: Payer: Self-pay

## 2016-07-18 ENCOUNTER — Encounter: Payer: Self-pay | Admitting: Family Medicine

## 2016-07-18 VITALS — BP 126/74 | HR 112 | Temp 98.1°F | Resp 16 | Wt 261.0 lb

## 2016-07-18 DIAGNOSIS — M1A00X0 Idiopathic chronic gout, unspecified site, without tophus (tophi): Secondary | ICD-10-CM | POA: Diagnosis not present

## 2016-07-18 DIAGNOSIS — M79672 Pain in left foot: Secondary | ICD-10-CM | POA: Insufficient documentation

## 2016-07-18 DIAGNOSIS — G4733 Obstructive sleep apnea (adult) (pediatric): Secondary | ICD-10-CM | POA: Diagnosis not present

## 2016-07-18 DIAGNOSIS — E1142 Type 2 diabetes mellitus with diabetic polyneuropathy: Secondary | ICD-10-CM

## 2016-07-18 DIAGNOSIS — E782 Mixed hyperlipidemia: Secondary | ICD-10-CM | POA: Diagnosis not present

## 2016-07-18 LAB — POCT GLYCOSYLATED HEMOGLOBIN (HGB A1C)
Est. average glucose Bld gHb Est-mCnc: 203
Hemoglobin A1C: 8.7

## 2016-07-18 NOTE — Telephone Encounter (Signed)
-----   Message from Tamsen Roersennis E Chrismon, [place] sent at 07/18/2016  1:53 PM EST ----- Normal x-ray of the left foot. No bony abnormalities. Awaiting lab reports.

## 2016-07-18 NOTE — Telephone Encounter (Signed)
Advised patient of results.  

## 2016-07-18 NOTE — Telephone Encounter (Signed)
Left message to call back  

## 2016-07-18 NOTE — Progress Notes (Signed)
Patient: Anthony Mitchell Male    DOB: 04-Jul-1975   41 y.o.   MRN: [id number] Visit Date: 07/18/2016  Today's Provider: Dortha Kernennis Jocsan Mcginley, PA   Chief Complaint  Patient presents with  . Diabetes  . Sleep Apnea   Subjective:    HPI  Prediabetes, Follow-up:   Lab Results  Component Value Date   HGBA1C 6.9 (H) 11/09/2015   GLUCOSE 141 (H) 11/09/2015   GLUCOSE 102 (H) 03/17/2013    Last seen for for this6 months ago.  Management since that visit includes adding Metformin. However, patient reports he feels he doesn't need to take it.  Current symptoms include none and have been stable.  Weight trend: stable Prior visit with dietician: no Current diet: well balanced Current exercise: none  Pertinent Labs:    Component Value Date/Time   CHOL 205 (H) 11/09/2015 1009   TRIG 224 (H) 11/09/2015 1009   CHOLHDL 5.5 (H) 11/09/2015 1009   CREATININE 1.11 11/09/2015 1009   CREATININE 1.27 03/17/2013 1832    Wt Readings from Last 3 Encounters:  07/18/16 261 lb (118.4 kg)  06/09/16 260 lb (117.9 kg)  12/14/15 265 lb (120.2 kg)   Sleep Apnea, follow up: Patient was last seen about 6 months ago. Patient had a sleep study done on 11/21/2015 which revealed he had severe sleep apnea. Patient reports that he used his CPAP nightly for about 2 months, but he reports that the mask no longer fits. Has ordered a medium size mask and will give it a try. New insurance will help cover supplies better than Medicaid did. When he uses the CPAP regularly with a good seal, sleep and energy level was better. Could not get an appropriate size mask earlier in 2017.  Patient Active Problem List   Diagnosis Date Noted  . Type 2 diabetes mellitus (HCC) 12/14/2015  . Hyperlipidemia 12/14/2015  . Obstructive sleep apnea 12/14/2015  . Acid reflux 03/02/2015  . 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 Extreme obesity (HCC) 03/02/2015  . Acute idiopathic gout of multiple sites 03/27/2014  . Chronic gouty  arthropathy 12/04/2008   Past Surgical History:  Procedure Laterality Date  . NO PAST SURGERIES     Family History  Problem Relation Age of Onset  . COPD Father   . Heart disease Father   . Hypertension Father   . Healthy Sister   . Gout Brother   . Heart attack Maternal Grandfather   . Gout Maternal Grandfather   . Alcohol abuse Maternal Grandfather   . Other Mother     tachycardiac      Allergies  Allergen Reactions  . Hydrocodone-Acetaminophen     itching and rash     Current Outpatient Prescriptions:  .  allopurinol (ZYLOPRIM) 300 MG tablet, Take 2 tablets (600 mg total) by mouth daily., Disp: 60 tablet, Rfl: 1 .  ibuprofen (ADVIL,MOTRIN) 600 MG tablet, Take 1 tablet by mouth as needed., Disp: , Rfl:  .  MULTIPLE VITAMIN PO, Take 1 tablet by mouth daily., Disp: , Rfl:  .  predniSONE (DELTASONE) 10 MG tablet, Take 1 tablet (10 mg total) by mouth daily., Disp: 42 tablet, Rfl: 0 .  colchicine 0.6 MG tablet, Take 0.6 mg by mouth., Disp: , Rfl:  .  metFORMIN (GLUCOPHAGE) 500 MG tablet, Take 1 tablet (500 mg total) by mouth daily with breakfast. (Patient not taking: Reported on 07/18/2016), Disp: 30 tablet, Rfl: 3 .  Omega-3 Fatty Acids (FISH OIL) 500 MG  CAPS, Take by mouth., Disp: , Rfl:  .  oxyCODONE-acetaminophen (ROXICET) 5-325 MG tablet, Take 1 tablet by mouth every 6 (six) hours as needed for severe pain. (Patient not taking: Reported on 07/18/2016), Disp: 10 tablet, Rfl: 0 .  pravastatin (PRAVACHOL) 20 MG tablet, Take 1 tablet (20 mg total) by mouth daily. (Patient not taking: Reported on 07/18/2016), Disp: 30 tablet, Rfl: 3  Review of Systems  Constitutional: Negative.   Respiratory: Negative.   Cardiovascular: Negative.   Musculoskeletal: Positive for arthralgias and myalgias.  Neurological: Negative.     Social History  Substance Use Topics  . Smoking status: Never Smoker  . Smokeless tobacco: Never Used  . Alcohol use No   Objective:   BP 126/74 (BP  Location: Right Arm, Patient Position: Sitting, Cuff Size: Large)   Pulse (!) 112   Temp 98.1 F (36.7 C)   Resp 16   Wt 261 lb (118.4 kg)   SpO2 96%   BMI 38.54 kg/m   Physical Exam  Constitutional: He is oriented to person, place, and time. He appears well-developed and well-nourished. No distress.  HENT:  Head: Normocephalic and atraumatic.  Right Ear: Hearing and external ear normal.  Left Ear: Hearing and external ear normal.  Nose: Nose normal.  Mouth/Throat: Oropharynx is clear and moist.  Eyes: Conjunctivae and lids are normal. Right eye exhibits no discharge. Left eye exhibits no discharge. No scleral icterus.  Neck: Neck supple. No JVD present. No thyromegaly present.  Cardiovascular: Normal rate, regular rhythm and normal heart sounds.   Pulmonary/Chest: Effort normal. No respiratory distress.  Abdominal: Bowel sounds are normal.  Musculoskeletal: Normal range of motion.  Pain to palpate or check ROM of the left ankle. Left great toe is stiff and unable to flex since soccer injury years ago. Tender along the left heel anteriorly and posteriorly. Good pulses without redness.   Lymphadenopathy:    He has no cervical adenopathy.  Neurological: He is alert and oriented to person, place, and time.  Loss of sensation in the toes and ball of the foot to test with nylon string. Good pulses and no deformities. No corns or significant calluses.  Skin: Skin is intact. No lesion and no rash noted.  Psychiatric: He has a normal mood and affect. His speech is normal and behavior is normal. Thought content normal.      Assessment & Plan:     1. Type 2 diabetes mellitus with diabetic polyneuropathy, without long-term current use of insulin (HCC) Poor compliance with Metformin due to insurance. Needs to lose more weight. Hgb A1C was 8.7% with estimated average glucose of 203 today. Will recheck CBC and CMP. Must restart Metformin 500 mg qd and check FBS daily. Schedule follow up in 3  months to adjust dosage/treatment plan. - POCT glycosylated hemoglobin (Hb A1C) - CBC with Differential/Platelet - Comprehensive metabolic panel  2. Mixed hyperlipidemia Still trying to work on low fat diet but unable to exercise due to frequent gout attacks and suspected plantar fasciitis with diabetic peripheral neuropathy. Has not been taking the Pravastatin regularly. Will recheck lipids and CMP. Recheck pending reports. - Lipid panel - Comprehensive metabolic panel  3. Chronic gouty arthropathy Presently on Allopurinol 600 mg qd per rheumatologist at [facility]. Periodically has to use prednisone to control acute attacks. Will continue follow up with rheumatologist. - CBC with Differential/Platelet  4. OSA (obstructive sleep apnea) Sleep study confirmed and set up with CPAP with auto-titration in June 2017. Initially had much  improved sleep and energy level using the CPAP every night for 5-6 hours. Mask began to leak and had difficulty getting the correct fit. Has ordered an medium size mask and will continue to use the CPAP each night. He decided to order his own mask since he could not get the size he needed through his usual CPAP supplier. Even with a leaking mask, he still gets good benefit from the CPAP use. Spouse not complaining of snoring or episodes of apnea when using the machine.  5. Pain of left heel Left heel pain over the past few weeks. Has tried using ice massage and stretching exercises but has pain when he first bears weight on his feet. Has a history of gout but no recent flares. Will check x-ray. Suspect plantar fasciitis versus Achille's bursitis. - DG Foot Complete Left        Dortha Kern, PA  [facility]

## 2016-07-19 LAB — CBC WITH DIFFERENTIAL/PLATELET
BASOS ABS: 0.1 10*3/uL (ref 0.0–0.2)
Basos: 0 %
EOS (ABSOLUTE): 0.2 10*3/uL (ref 0.0–0.4)
Eos: 1 %
HEMOGLOBIN: 15.9 g/dL (ref 13.0–17.7)
Hematocrit: 46.4 % (ref 37.5–51.0)
Immature Grans (Abs): 0.2 10*3/uL — ABNORMAL HIGH (ref 0.0–0.1)
Immature Granulocytes: 1 %
LYMPHS ABS: 2.9 10*3/uL (ref 0.7–3.1)
LYMPHS: 18 %
MCH: 29.1 pg (ref 26.6–33.0)
MCHC: 34.3 g/dL (ref 31.5–35.7)
MCV: 85 fL (ref 79–97)
MONOCYTES: 9 %
Monocytes Absolute: 1.5 10*3/uL — ABNORMAL HIGH (ref 0.1–0.9)
Neutrophils Absolute: 11.2 10*3/uL — ABNORMAL HIGH (ref 1.4–7.0)
Neutrophils: 71 %
Platelets: 258 10*3/uL (ref 150–379)
RBC: 5.47 x10E6/uL (ref 4.14–5.80)
RDW: 13.8 % (ref 12.3–15.4)
WBC: 16.1 10*3/uL — AB (ref 3.4–10.8)

## 2016-07-19 LAB — COMPREHENSIVE METABOLIC PANEL
ALBUMIN: 4.5 g/dL (ref 3.5–5.5)
ALK PHOS: 110 IU/L (ref 39–117)
ALT: 28 IU/L (ref 0–44)
AST: 15 IU/L (ref 0–40)
Albumin/Globulin Ratio: 1.5 (ref 1.2–2.2)
BUN/Creatinine Ratio: 15 (ref 9–20)
BUN: 15 mg/dL (ref 6–20)
Bilirubin Total: 0.8 mg/dL (ref 0.0–1.2)
CO2: 26 mmol/L (ref 18–29)
CREATININE: 1.03 mg/dL (ref 0.76–1.27)
Calcium: 9.8 mg/dL (ref 8.7–10.2)
Chloride: 95 mmol/L — ABNORMAL LOW (ref 96–106)
GFR calc Af Amer: 106 mL/min/{1.73_m2} (ref >59)
GFR calc non Af Amer: 92 mL/min/{1.73_m2} (ref >59)
GLUCOSE: 160 mg/dL — AB (ref 65–99)
Globulin, Total: 3.1 g/dL (ref 1.5–4.5)
Potassium: 4.7 mmol/L (ref 3.5–5.2)
Sodium: 137 mmol/L (ref 134–144)
Total Protein: 7.6 g/dL (ref 6.0–8.5)

## 2016-07-19 LAB — LIPID PANEL
CHOL/HDL RATIO: 6.4 ratio — AB (ref 0.0–5.0)
Cholesterol, Total: 261 mg/dL — ABNORMAL HIGH (ref 100–199)
HDL: 41 mg/dL (ref >39)
Triglycerides: 624 mg/dL (ref 0–149)

## 2016-07-21 ENCOUNTER — Telehealth: Payer: Self-pay

## 2016-07-21 NOTE — Telephone Encounter (Signed)
-----   Message from Tamsen Roersennis E Chrismon, [place] sent at 07/21/2016  8:27 AM EST ----- Blood sugar higher than 8 months ago. Triglycerides so very high lab could not calculate the LDL level. Total cholesterol high at 261 and risk ratio higher than 8 months ago. Probably should go to Metformin 500 mg BID and switch pravastatin to Fenofibrate 150 mg qd #30 & 3RF. WBC count high as it was 3 years ago. Has he had any signs of infection? Need to recheck WBC count in 10-14 days to see if it is returning to normal.

## 2016-07-21 NOTE — Telephone Encounter (Signed)
Pt advised. Pt asking [facility] I can fax if it hasn't been sent. Thanks. Allene DillonEmily Drozdowski, CMA

## 2016-07-21 NOTE — Telephone Encounter (Signed)
OK 

## 2016-07-21 NOTE — Telephone Encounter (Signed)
Did not see in the pile. Faxed LOV to sleep med. Allene DillonEmily Drozdowski, CMA

## 2016-07-21 NOTE — Telephone Encounter (Signed)
Check to see if it and a copy of the last office visit note is in the stack to be faxed.

## 2016-07-21 NOTE — Telephone Encounter (Signed)
Pt advised of results. Pt does not wish to make any medications changes right. Reports he has been on prednisone for gout flare up. Pt would like to have labs rechecked in 8 weeks before making changes. Is this okay? Allene DillonEmily Drozdowski, CMA

## 2016-09-30 DIAGNOSIS — Z79899 Other long term (current) drug therapy: Secondary | ICD-10-CM | POA: Insufficient documentation

## 2016-09-30 HISTORY — DX: Other long term (current) drug therapy: Z79.899

## 2016-10-28 ENCOUNTER — Telehealth: Payer: Self-pay | Admitting: Family Medicine

## 2016-10-28 NOTE — Telephone Encounter (Signed)
Triaged patient and spoke with Maurine Ministerennis about patients blood sugar, patient reports that he has been on rounds of prednisone and has noticed over the past week or more blood sugar has been greater than 200. Patient states that he was previously on Metformin to better control his blood sugar but states that he began to lose weight and didn't feel medication was still beneficial. 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 Patient denies, blurred vision or visual disturbance or swelling of extremities, cold sweat chills. Patient requested back on Metformin 500mg  at higher dose to bring back his sugar levels to normal. Patient was advised that he is well over due for follow up, per Maurine Ministerennis okay for patient to restart Metformin and take twice a day instead of once. Patient agreed to return for follow up on 5/15.KW

## 2016-10-28 NOTE — Telephone Encounter (Signed)
Pt called saying his glucose has been 300-600 lately.  He wants to know if can go up on his metformin from  500 to higher.  We talked about him coming in.  He is really busy right now   His call back  [phone]  Thanks teri

## 2016-10-28 NOTE — Telephone Encounter (Signed)
LMTCB on both numbers

## 2016-11-04 ENCOUNTER — Other Ambulatory Visit: Payer: Self-pay

## 2016-11-04 ENCOUNTER — Ambulatory Visit: Payer: Self-pay | Admitting: Family Medicine

## 2016-11-04 DIAGNOSIS — E119 Type 2 diabetes mellitus without complications: Secondary | ICD-10-CM

## 2016-11-04 MED ORDER — METFORMIN HCL 500 MG PO TABS: 500.0000 mg | ORAL_TABLET | Freq: Two times a day (BID) | ORAL | 0 refills | Status: DC

## 2016-11-04 NOTE — Progress Notes (Deleted)
Patient: Anthony Mitchell Male    DOB: [birth year]   41 y.o.   MRN: [id number] Visit Date: 11/04/2016  Today's Provider: Dortha Kernennis Chrismon, PA   No chief complaint on file.  Subjective:    HPI      Diabetes Mellitus Type II, Follow-up:   Lab Results  Component Value Date   HGBA1C 8.7 07/18/2016   HGBA1C 6.9 (H) 11/09/2015    Last seen for diabetes 4 months ago.  Management since then includes advising pt to work on losing weight. PCP wanted to change Metformin to 500 mg BID, but pt refused.Marland Kitchen. He reports {excellent/good/fair/poor:19665} compliance with treatment. He {ACTION; IS/IS [id number]:[id number]} having side effects. *** Current symptoms include {Symptoms; diabetes:14075} and have been {Desc; course:15616}. Home blood sugar records: {diabetes glucometry results:16657}  Episodes of hypoglycemia? {yes***/no:17258}   Current Insulin Regimen: *** Most Recent Eye Exam: *** Weight trend: {trend:16658} Prior visit with dietician: {yes/no:17258} Current diet: {diet habits:16563} Current exercise: {exercise types:16438}  Pertinent Labs:    Component Value Date/Time   CHOL 261 (H) 07/18/2016 0932   TRIG 624 (HH) 07/18/2016 0932   HDL 41 07/18/2016 0932   LDLCALC Comment 07/18/2016 0932   CREATININE 1.03 07/18/2016 0932   CREATININE 1.27 03/17/2013 1832    Wt Readings from Last 3 Encounters:  07/18/16 261 lb (118.4 kg)  06/09/16 260 lb (117.9 kg)  12/14/15 265 lb (120.2 kg)    ------------------------------------------------------------------------  Lipid/Cholesterol, Follow-up:   Last seen for this 4 months ago.  Management changes since that visit include none. PCP wanted to change Pravastatin to Fenofibrate 150 mg po qd, but pt refused. . Last Lipid Panel:    Component Value Date/Time   CHOL 261 (H) 07/18/2016 0932   TRIG 624 (HH) 07/18/2016 0932   HDL 41 07/18/2016 0932   CHOLHDL 6.4 (H) 07/18/2016 0932   LDLCALC Comment 07/18/2016 0932    Risk  factors for vascular disease include {risk factors atherosclerosis:10337}  He reports {excellent/good/fair/poor:19665} compliance with treatment. He {ACTION; IS/IS [id number]}OT:21021397} having side effects.  -------------------------------------------------------------------   Allergies  Allergen Reactions  . Hydrocodone-Acetaminophen     itching and rash     Current Outpatient Prescriptions:  .  allopurinol (ZYLOPRIM) 300 MG tablet, Take 2 tablets (600 mg total) by mouth daily., Disp: 60 tablet, Rfl: 1 .  ibuprofen (ADVIL,MOTRIN) 600 MG tablet, Take 1 tablet by mouth as needed., Disp: , Rfl:  .  metFORMIN (GLUCOPHAGE) 500 MG tablet, Take 1 tablet (500 mg total) by mouth daily with breakfast. (Patient not taking: Reported on 07/18/2016), Disp: 30 tablet, Rfl: 3 .  MULTIPLE VITAMIN PO, Take 1 tablet by mouth daily., Disp: , Rfl:  .  Omega-3 Fatty Acids (FISH OIL) 500 MG CAPS, Take by mouth., Disp: , Rfl:  .  oxyCODONE-acetaminophen (ROXICET) 5-325 MG tablet, Take 1 tablet by mouth every 6 (six) hours as needed for severe pain. (Patient not taking: Reported on 07/18/2016), Disp: 10 tablet, Rfl: 0 .  pravastatin (PRAVACHOL) 20 MG tablet, Take 1 tablet (20 mg total) by mouth daily. (Patient not taking: Reported on 07/18/2016), Disp: 30 tablet, Rfl: 3 .  predniSONE (DELTASONE) 10 MG tablet, Take 1 tablet (10 mg total) by mouth daily., Disp: 42 tablet, Rfl: 0  Review of Systems  Social History  Substance Use Topics  . Smoking status: Never Smoker  . Smokeless tobacco: Never Used  . Alcohol use No   Objective:   There were no vitals taken for this visit.  There were no vitals filed for this visit.   Physical Exam      Assessment & Plan:           Dortha Kern, PA  [facility]

## 2016-11-04 NOTE — Telephone Encounter (Signed)
Pt had OV today, but states his insurance has unknowingly been terminated. Pt is requesting to cancel FU and receive a refill of Metformin until he gets insurance. Maurine MinisterDennis verbally agrees, and pt is aware to reschedule FU ASAP. Allene DillonEmily Drozdowski, CMA

## 2016-12-01 ENCOUNTER — Other Ambulatory Visit: Payer: Self-pay | Admitting: Family Medicine

## 2016-12-01 DIAGNOSIS — E119 Type 2 diabetes mellitus without complications: Secondary | ICD-10-CM

## 2017-05-21 DIAGNOSIS — I2510 Atherosclerotic heart disease of native coronary artery without angina pectoris: Secondary | ICD-10-CM

## 2017-05-21 HISTORY — DX: Atherosclerotic heart disease of native coronary artery without angina pectoris: I25.10

## 2017-05-22 MED ORDER — DEXTROSE 50 % IV SOLN
12.50 | INTRAVENOUS | Status: DC
Start: ? — End: 2017-05-22

## 2017-05-22 MED ORDER — INSULIN NPH ISOPHANE & REGULAR (70-30) 100 UNIT/ML ~~LOC~~ SUSP
18.00 | SUBCUTANEOUS | Status: DC
Start: 2017-05-22 — End: 2017-05-22

## 2017-05-22 MED ORDER — LISINOPRIL 5 MG PO TABS
5.00 | ORAL_TABLET | ORAL | Status: DC
Start: 2017-05-23 — End: 2017-05-22

## 2017-05-22 MED ORDER — ONDANSETRON HCL 4 MG PO TABS
4.00 | ORAL_TABLET | ORAL | Status: DC
Start: ? — End: 2017-05-22

## 2017-05-22 MED ORDER — NITROGLYCERIN 0.4 MG SL SUBL
0.40 | SUBLINGUAL_TABLET | SUBLINGUAL | Status: DC
Start: ? — End: 2017-05-22

## 2017-05-22 MED ORDER — CALCIUM CARBONATE ANTACID 750 MG PO CHEW
CHEWABLE_TABLET | ORAL | Status: DC
Start: ? — End: 2017-05-22

## 2017-05-22 MED ORDER — GENERIC EXTERNAL MEDICATION
1.00 | Status: DC
Start: ? — End: 2017-05-22

## 2017-05-22 MED ORDER — ASPIRIN EC 81 MG PO TBEC
81.00 | DELAYED_RELEASE_TABLET | ORAL | Status: DC
Start: 2017-05-23 — End: 2017-05-22

## 2017-05-22 MED ORDER — ACETAMINOPHEN 325 MG PO TABS
650.00 | ORAL_TABLET | ORAL | Status: DC
Start: ? — End: 2017-05-22

## 2017-05-22 MED ORDER — ATORVASTATIN CALCIUM 80 MG PO TABS
80.00 | ORAL_TABLET | ORAL | Status: DC
Start: 2017-05-23 — End: 2017-05-22

## 2017-05-22 MED ORDER — METOPROLOL TARTRATE 50 MG PO TABS
50.00 | ORAL_TABLET | ORAL | Status: DC
Start: 2017-05-22 — End: 2017-05-22

## 2017-05-22 MED ORDER — LIDOCAINE HCL (PF) 1 % IJ SOLN
.50 | INTRAMUSCULAR | Status: DC
Start: ? — End: 2017-05-22

## 2017-05-22 MED ORDER — GLUCAGON HCL RDNA (DIAGNOSTIC) 1 MG IJ SOLR
1.00 | INTRAMUSCULAR | Status: DC
Start: ? — End: 2017-05-22

## 2017-05-22 MED ORDER — ALLOPURINOL 300 MG PO TABS
300.00 | ORAL_TABLET | ORAL | Status: DC
Start: 2017-05-23 — End: 2017-05-22

## 2017-05-22 MED ORDER — TICAGRELOR 90 MG PO TABS
90.00 | ORAL_TABLET | ORAL | Status: DC
Start: 2017-05-22 — End: 2017-05-22

## 2017-05-22 MED ORDER — INSULIN NPH ISOPHANE & REGULAR (70-30) 100 UNIT/ML ~~LOC~~ SUSP
30.00 | SUBCUTANEOUS | Status: DC
Start: 2017-05-23 — End: 2017-05-22

## 2017-07-08 ENCOUNTER — Encounter: Payer: BLUE CROSS/BLUE SHIELD | Attending: Cardiovascular Disease | Admitting: *Deleted

## 2017-07-08 ENCOUNTER — Encounter: Payer: Self-pay | Admitting: *Deleted

## 2017-07-08 VITALS — Ht 70.0 in | Wt 238.6 lb

## 2017-07-08 DIAGNOSIS — Z955 Presence of coronary angioplasty implant and graft: Secondary | ICD-10-CM

## 2017-07-08 DIAGNOSIS — I213 ST elevation (STEMI) myocardial infarction of unspecified site: Secondary | ICD-10-CM | POA: Diagnosis not present

## 2017-07-08 NOTE — Progress Notes (Signed)
Daily Session Note  Patient Details  Name: Anthony Mitchell MRN: [id number] Date of Birth: 17-Jun-1976 Referring Provider:     Cardiac Rehab from 07/08/2017 in [facility]  Referring Provider  navar      Encounter Date: 07/08/2017  Check In: Session Check In - 07/08/17 1226      Check-In   Location  [facility]    Staff Present  Darel Hong, RN Vickki Hearing, BA, ACSM CEP, Exercise Physiologist    Supervising physician immediately available to respond to emergencies  See telemetry face sheet for immediately available ER MD    Medication changes reported      Yes    Comments  meds updated in [facility]    Fall or balance concerns reported     No    Tobacco Cessation  No Change    Warm-up and Cool-down  Performed as group-led instruction    Resistance Training Performed  Yes    VAD Patient?  No      Pain Assessment   Currently in Pain?  No/denies    Multiple Pain Sites  No        Exercise Prescription Changes - 07/08/17 1200      Response to Exercise   Blood Pressure (Admit)  104/68    Blood Pressure (Exercise)  126/82    Blood Pressure (Exit)  112/76    Heart Rate (Admit)  75 bpm    Heart Rate (Exercise)  113 bpm    Heart Rate (Exit)  83 bpm    Oxygen Saturation (Admit)  98 %    Oxygen Saturation (Exercise)  99 %    Rating of Perceived Exertion (Exercise)  11       Social History   Tobacco Use  Smoking Status Never Smoker  Smokeless Tobacco Never Used    Goals Met:  Proper associated with RPD/PD & O2 Sat Exercise tolerated well Personal goals reviewed No report of cardiac concerns or symptoms Strength training completed today  Goals Unmet:  Not Applicable  Comments: Medical Review and 6MW test completed.  Dr. Emily Filbert is Medical Director for [place] and [facility].

## 2017-07-08 NOTE — Progress Notes (Signed)
Cardiac Individual Treatment Plan  Patient Details  Name: Anthony Mitchell MRN: [id number] Date of Birth: 11-04-1975 Referring Provider:     Cardiac Rehab from 07/08/2017 in [facility] Cardiac and Pulmonary Rehab  Referring Provider  navar      Initial Encounter Date:    Cardiac Rehab from 07/08/2017 in [facility] Cardiac and Pulmonary Rehab  Date  07/08/17  Referring Provider  navar      Visit Diagnosis: ST elevation myocardial infarction (STEMI), unspecified artery ([place])  S/P coronary artery stent placement  Patient's Home Medications on Admission:  Current Outpatient Medications:  .  aspirin EC 81 MG tablet, Take by mouth., Disp: , Rfl:  .  Blood Glucose Monitoring Suppl (FIFTY50 GLUCOSE METER 2.0) w/Device KIT, Use as directed, Disp: , Rfl:  .  metoprolol tartrate (LOPRESSOR) 50 MG tablet, Take by mouth., Disp: , Rfl:  .  nitroGLYCERIN (NITROSTAT) 0.4 MG SL tablet, Place under the tongue., Disp: , Rfl:  .  predniSONE (DELTASONE) 10 MG tablet, 10 MG TABLETS TAPERED AS INDICATED (6, 6, 5,5, 4,4,3,3,2,2,1,1) FOR GOUTY FLARE, Disp: , Rfl:  .  Semaglutide (OZEMPIC) 0.25 or 0.5 MG/DOSE SOPN, Inject into the skin., Disp: , Rfl:  .  ticagrelor (BRILINTA) 90 MG TABS tablet, Take by mouth., Disp: , Rfl:  .  allopurinol (ZYLOPRIM) 300 MG tablet, Take 2 tablets (600 mg total) by mouth daily., Disp: 60 tablet, Rfl: 1 .  atorvastatin (LIPITOR) 80 MG tablet, Take 80 mg by mouth daily., Disp: , Rfl: 2 .  gabapentin (NEURONTIN) 100 MG capsule, TAKE 1 CAPSULE BY MOUTH THREE TIMES A DAY, Disp: , Rfl: 11 .  ibuprofen (ADVIL,MOTRIN) 600 MG tablet, Take 1 tablet by mouth as needed., Disp: , Rfl:  .  lisinopril (PRINIVIL,ZESTRIL) 5 MG tablet, Take 5 mg by mouth daily., Disp: , Rfl: 2 .  metFORMIN (GLUCOPHAGE) 500 MG tablet, TAKE 1 TABLET (500 MG TOTAL) BY MOUTH 2 (TWO) TIMES DAILY WITH A MEAL., Disp: 60 tablet, Rfl: 0 .  Multiple Vitamin (MULTI-VITAMINS) TABS, Take by mouth., Disp: , Rfl:  .  MULTIPLE VITAMIN  PO, Take 1 tablet by mouth daily., Disp: , Rfl:  .  Omega-3 Fatty Acids (FISH OIL) 500 MG CAPS, Take by mouth., Disp: , Rfl:  .  oxyCODONE-acetaminophen (ROXICET) 5-325 MG tablet, Take 1 tablet by mouth every 6 (six) hours as needed for severe pain. (Patient not taking: Reported on 07/18/2016), Disp: 10 tablet, Rfl: 0 .  pravastatin (PRAVACHOL) 20 MG tablet, Take 1 tablet (20 mg total) by mouth daily. (Patient not taking: Reported on 07/18/2016), Disp: 30 tablet, Rfl: 3 .  predniSONE (DELTASONE) 10 MG tablet, Take 1 tablet (10 mg total) by mouth daily., Disp: 42 tablet, Rfl: 0  Past Medical History: Past Medical History:  Diagnosis Date  . Gout     Tobacco Use: Social History   Tobacco Use  Smoking Status Never Smoker  Smokeless Tobacco Never Used    Labs: Recent Review Flowsheet Data    Labs for ITP Cardiac and Pulmonary Rehab Latest Ref Rng & Units 11/09/2015 07/18/2016   Cholestrol 100 - 199 mg/dL 205(H) 261(H)   LDLCALC 0 - 99 mg/dL 123(H) Comment   HDL >39 mg/dL 37(L) 41   Trlycerides 0 - 149 mg/dL 224(H) 624(HH)   Hemoglobin A1c - 6.9(H) 8.7       Exercise Target Goals: Date: 07/08/17  Exercise Program Goal: Individual exercise prescription set using results from initial 6 min walk test and THRR while considering  patient's activity barriers and safety.   Exercise Prescription Goal: Initial exercise prescription builds to 30-45 minutes a day of aerobic activity, 2-3 days per week.  Home exercise guidelines will be given to patient during program as part of exercise prescription that the participant will acknowledge.  Activity Barriers & Risk Stratification: Activity Barriers & Cardiac Risk Stratification - 07/08/17 1249      Activity Barriers & Cardiac Risk Stratification   Activity Barriers  Deconditioning;Other (comment)    Comments  gout since he was a teenager, takes prednisone with flare up.     Cardiac Risk Stratification  High       6 Minute Walk: 6 Minute  Walk    Row Name 07/08/17 1218         6 Minute Walk   Distance  1700 feet     Walk Time  6 minutes     # of Rest Breaks  0     MPH  3.22     METS  4.99     RPE  11     VO2 Peak  17.47     Symptoms  Yes (comment)     Comments  muscular calf pain 2/10     Resting HR  75 bpm     Resting BP  104/68     Resting Oxygen Saturation   98 %     Exercise Oxygen Saturation  during 6 min walk  99 %     Max Ex. HR  113 bpm     Max Ex. BP  126/82     2 Minute Post BP  112/76        Oxygen Initial Assessment: Oxygen Initial Assessment - 07/08/17 1301      Home Oxygen   Sleep Oxygen Prescription  BiPAP has but since he has lost weight it does not fit and he is not having as many problems.        Oxygen Re-Evaluation:   Oxygen Discharge (Final Oxygen Re-Evaluation):   Initial Exercise Prescription: Initial Exercise Prescription - 07/08/17 1200      Date of Initial Exercise RX and Referring Provider   Date  07/08/17    Referring Provider  navar      Treadmill   MPH  3.2    Grade  3.5    Minutes  15    METs  4.99      Elliptical   Level  1    Speed  3    Minutes  15      REL-XR   Level  5    Watts  103    Speed  50    Minutes  15    METs  4.99      T5 Nustep   Level  3    Minutes  15    METs  4.99      Prescription Details   Frequency (times per week)  3    Duration  Progress to 45 minutes of aerobic exercise without signs/symptoms of physical distress      Intensity   THRR 40-80% of Max Heartrate  117-159    Ratings of Perceived Exertion  11-13    Perceived Dyspnea  0-4      Resistance Training   Training Prescription  Yes    Weight  5 lb    Reps  10-15       Perform Capillary Blood Glucose checks as needed.  Exercise Prescription Changes: Exercise Prescription  Changes    Row Name 07/08/17 1200             Response to Exercise   Blood Pressure (Admit)  104/68       Blood Pressure (Exercise)  126/82       Blood Pressure (Exit)  112/76        Heart Rate (Admit)  75 bpm       Heart Rate (Exercise)  113 bpm       Heart Rate (Exit)  83 bpm       Oxygen Saturation (Admit)  98 %       Oxygen Saturation (Exercise)  99 %       Rating of Perceived Exertion (Exercise)  11          Exercise Comments:   Exercise Goals and Review: Exercise Goals    Row Name 07/08/17 1217             Exercise Goals   Increase Physical Activity  Yes       Intervention  Provide advice, education, support and counseling about physical activity/exercise needs.;Develop an individualized exercise prescription for aerobic and resistive training based on initial evaluation findings, risk stratification, comorbidities and participant's personal goals.       Expected Outcomes  Short Term: Attend rehab on a regular basis to increase amount of physical activity.;Long Term: Add in home exercise to make exercise part of routine and to increase amount of physical activity.;Long Term: Exercising regularly at least 3-5 days a week.       Increase Strength and Stamina  Yes       Intervention  Provide advice, education, support and counseling about physical activity/exercise needs.;Develop an individualized exercise prescription for aerobic and resistive training based on initial evaluation findings, risk stratification, comorbidities and participant's personal goals.       Expected Outcomes  Short Term: Increase workloads from initial exercise prescription for resistance, speed, and METs.;Short Term: Perform resistance training exercises routinely during rehab and add in resistance training at home;Long Term: Improve cardiorespiratory fitness, muscular endurance and strength as measured by increased METs and functional capacity (6MWT)       Able to understand and use rate of perceived exertion (RPE) scale  Yes       Intervention  Provide education and explanation on how to use RPE scale       Expected Outcomes  Short Term: Able to use RPE daily in rehab to express  subjective intensity level;Long Term:  Able to use RPE to guide intensity level when exercising independently       Able to understand and use Dyspnea scale  Yes       Intervention  Provide education and explanation on how to use Dyspnea scale       Expected Outcomes  Short Term: Able to use Dyspnea scale daily in rehab to express subjective sense of shortness of breath during exertion;Long Term: Able to use Dyspnea scale to guide intensity level when exercising independently       Knowledge and understanding of Target Heart Rate Range (THRR)  Yes       Intervention  Provide education and explanation of THRR including how the numbers were predicted and where they are located for reference       Expected Outcomes  Short Term: Able to state/look up THRR;Long Term: Able to use THRR to govern intensity when exercising independently;Short Term: Able to use daily as guideline for intensity in rehab  Able to check pulse independently  Yes       Intervention  Provide education and demonstration on how to check pulse in carotid and radial arteries.;Review the importance of being able to check your own pulse for safety during independent exercise       Expected Outcomes  Short Term: Able to explain why pulse checking is important during independent exercise;Long Term: Able to check pulse independently and accurately       Understanding of Exercise Prescription  Yes       Intervention  Provide education, explanation, and written materials on patient's individual exercise prescription       Expected Outcomes  Short Term: Able to explain program exercise prescription;Long Term: Able to explain home exercise prescription to exercise independently          Exercise Goals Re-Evaluation :   Discharge Exercise Prescription (Final Exercise Prescription Changes): Exercise Prescription Changes - 07/08/17 1200      Response to Exercise   Blood Pressure (Admit)  104/68    Blood Pressure (Exercise)  126/82     Blood Pressure (Exit)  112/76    Heart Rate (Admit)  75 bpm    Heart Rate (Exercise)  113 bpm    Heart Rate (Exit)  83 bpm    Oxygen Saturation (Admit)  98 %    Oxygen Saturation (Exercise)  99 %    Rating of Perceived Exertion (Exercise)  11       Nutrition:  Target Goals: Understanding of nutrition guidelines, daily intake of sodium <1553m, cholesterol <2039m calories 30% from fat and 7% or less from saturated fats, daily to have 5 or more servings of fruits and vegetables.  Biometrics: Pre Biometrics - 07/08/17 1216      Pre Biometrics   Height  5' 10"  (1.778 m)    Weight  238 lb 9.6 oz (108.2 kg)    Waist Circumference  42 inches    Hip Circumference  43.5 inches    Waist to Hip Ratio  0.97 %    BMI (Calculated)  34.24    Single Leg Stand  25.3 seconds        Nutrition Therapy Plan and Nutrition Goals: Nutrition Therapy & Goals - 07/08/17 1246      Intervention Plan   Intervention  Prescribe, educate and counsel regarding individualized specific dietary modifications aiming towards targeted core components such as weight, hypertension, lipid management, diabetes, heart failure and other comorbidities.;Nutrition handout(s) given to patient.    Expected Outcomes  Short Term Goal: Understand basic principles of dietary content, such as calories, fat, sodium, cholesterol and nutrients.;Short Term Goal: A plan has been developed with personal nutrition goals set during dietitian appointment.;Long Term Goal: Adherence to prescribed nutrition plan.       Nutrition Assessments: Nutrition Assessments - 07/08/17 1246      MEDFICTS Scores   Pre Score  -- Paperwork sent home with patient       Nutrition Goals Re-Evaluation:   Nutrition Goals Discharge (Final Nutrition Goals Re-Evaluation):   Psychosocial: Target Goals: Acknowledge presence or absence of significant depression and/or stress, maximize coping skills, provide positive support system. Participant is able to  verbalize types and ability to use techniques and skills needed for reducing stress and depression.   Initial Review & Psychosocial Screening: Initial Psych Review & Screening - 07/08/17 1246      Initial Review   Current issues with  Current Stress Concerns    Source of Stress Concerns  Occupation    Comments  He has a new baby in the house 4th child born 6 month after his STEMI. 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 He was initially stressed about going back to work a couple weeks ago but states it is getting better, although he expressed desire to talk with Juliann Pulse about coping strageties to handle stressful job.       Family Dynamics   Good Support System?  Yes    Comments  spouse, family, friends      Screening Interventions   Interventions  Encouraged to exercise;Program counselor consult;To provide support and resources with identified psychosocial needs;Provide feedback about the scores to participant    Expected Outcomes  Short Term goal: Utilizing psychosocial counselor, staff and physician to assist with identification of specific Stressors or current issues interfering with healing process. Setting desired goal for each stressor or current issue identified.;Long Term Goal: Stressors or current issues are controlled or eliminated.;Short Term goal: Identification and review with participant of any Quality of Life or Depression concerns found by scoring the questionnaire.;Long Term goal: The participant improves quality of Life and PHQ9 Scores as seen by post scores and/or verbalization of changes       Quality of Life Scores:  Quality of Life - 07/08/17 1249      Quality of Life Scores   Health/Function Pre  26.8 %    Socioeconomic Pre  26.57 %    Psych/Spiritual Pre  25.71 %    Family Pre  30 %    GLOBAL Pre  27 %      Scores of 19 and below usually indicate a poorer quality of life in these areas.  A difference of  2-3 points  is a clinically meaningful difference.  A difference of 2-3 points in the total score of the Quality of Life Index has been associated with significant improvement in overall quality of life, self-image, physical symptoms, and general health in studies assessing change in quality of life.  PHQ-9: Recent Review Flowsheet Data    Depression screen [facility] 2/9 07/08/2017 11/09/2015   Decreased Interest 0 0   Down, Depressed, Hopeless 0 0   PHQ - 2 Score 0 0   Altered sleeping 0 -   Tired, decreased energy 1 -   Change in appetite 0 -   Feeling bad or failure about yourself  1 -   Trouble concentrating 0 -   Moving slowly or fidgety/restless 0 -   Suicidal thoughts 0 -   PHQ-9 Score 2 -   Difficult doing work/chores Somewhat difficult -     Interpretation of Total Score  Total Score Depression Severity:  1-4 = Minimal depression, 5-9 = Mild depression, 10-14 = Moderate depression, 15-19 = Moderately severe depression, 20-27 = Severe depression   Psychosocial Evaluation and Intervention:   Psychosocial Re-Evaluation:   Psychosocial Discharge (Final Psychosocial Re-Evaluation):   Vocational Rehabilitation: Provide vocational rehab assistance to qualifying candidates.   Vocational Rehab Evaluation & Intervention: Vocational Rehab - 07/08/17 1252      Initial Vocational Rehab Evaluation & Intervention   Assessment shows need for Vocational Rehabilitation  No       Education: Education Goals: Education classes will be provided on a variety of topics geared toward better understanding of heart health and risk factor modification. Participant will state understanding/return demonstration of topics presented as noted by education test scores.  Learning  Barriers/Preferences: Learning Barriers/Preferences - 07/08/17 1251      Learning Barriers/Preferences   Learning Barriers  None    Learning Preferences  Individual Instruction;Skilled Demonstration hands on learning        Education Topics: General Nutrition Guidelines/Fats and Fiber: -Group instruction provided by verbal, written material, models and posters to present the general guidelines for heart healthy nutrition. Gives an explanation and review of dietary fats and fiber.   Controlling Sodium/Reading Food Labels: -Group verbal and written material supporting the discussion of sodium use in heart healthy nutrition. Review and explanation with models, verbal and written materials for utilization of the food label.   Exercise Physiology & Risk Factors: - Group verbal and written instruction with models to review the exercise physiology of the cardiovascular system and associated critical values. Details cardiovascular disease risk factors and the goals associated with each risk factor.   Aerobic Exercise & Resistance Training: - Gives group verbal and written discussion on the health impact of inactivity. On the components of aerobic and resistive training programs and the benefits of this training and how to safely progress through these programs.   Flexibility, Balance, General Exercise Guidelines: - Provides group verbal and written instruction on the benefits of flexibility and balance training programs. Provides general exercise guidelines with specific guidelines to those with heart or lung disease. Demonstration and skill practice provided.   Stress Management: - Provides group verbal and written instruction about the health risks of elevated stress, cause of high stress, and healthy ways to reduce stress.   Depression: - Provides group verbal and written instruction on the correlation between heart/lung disease and depressed mood, treatment options, and the stigmas associated with seeking treatment.   Anatomy & Physiology of the Heart: - Group verbal and written instruction and models provide basic cardiac anatomy and physiology, with the coronary electrical and arterial systems. Review  of: AMI, Angina, Valve disease, Heart Failure, Cardiac Arrhythmia, Pacemakers, and the ICD.   Cardiac Procedures: - Group verbal and written instruction to review commonly prescribed medications for heart disease. Reviews the medication, class of the drug, and side effects. Includes the steps to properly store meds and maintain the prescription regimen. (beta blockers and nitrates)   Cardiac Medications I: - Group verbal and written instruction to review commonly prescribed medications for heart disease. Reviews the medication, class of the drug, and side effects. Includes the steps to properly store meds and maintain the prescription regimen.   Cardiac Medications II: -Group verbal and written instruction to review commonly prescribed medications for heart disease. Reviews the medication, class of the drug, and side effects. (all other drug classes)    Go Sex-Intimacy & Heart Disease, Get SMART - Goal Setting: - Group verbal and written instruction through game format to discuss heart disease and the return to sexual intimacy. Provides group verbal and written material to discuss and apply goal setting through the application of the S.M.A.R.T. Method.   Other Matters of the Heart: - Provides group verbal, written materials and models to describe Heart Failure, Angina, Valve Disease, Peripheral Artery Disease, and Diabetes in the realm of heart disease. Includes description of the disease process and treatment options available to the cardiac patient.   Exercise & Equipment Safety: - Individual verbal instruction and demonstration of equipment use and safety with use of the equipment.   Cardiac Rehab from 07/08/2017 in [facility] Cardiac and Pulmonary Rehab  Date  07/08/17  Educator  KS  Instruction Review Code  1- [employer]  Understanding      Infection Prevention: - Provides verbal and written material to individual with discussion of infection control including proper hand washing and  proper equipment cleaning during exercise session.   Cardiac Rehab from 07/08/2017 in [facility] Cardiac [facility] Pulmonary Rehab  Date  07/08/17  Educator  KS  Instruction Review Code  1- Verbalizes Understanding      Falls Prevention: - Provides verbal and written material to individual with discussion of falls prevention and safety.   Cardiac Rehab from 07/08/2017 in [facility] Cardiac and Pulmonary Rehab  Date  07/08/17  Educator  KS  Instruction Review Code  1- Verbalizes Understanding      Diabetes: - Individual verbal and written instruction to review signs/symptoms of diabetes, desired ranges of glucose level fasting, after meals and with exercise. Acknowledge that pre and post exercise glucose checks will be done for 3 sessions at entry of program.   Other: -Provides group and verbal instruction on various topics (see comments)    Knowledge Questionnaire Score: Knowledge Questionnaire Score - 07/08/17 1252      Knowledge Questionnaire Score   Pre Score  25/28 Reviewed correct answers with patient who verbalized understanding.       Core Components/Risk Factors/Patient Goals at Admission: Personal Goals and Risk Factors at Admission - 07/08/17 1239      Core Components/Risk Factors/Patient Goals on Admission    Weight Management  Yes;Weight Loss;Obesity    Intervention  Weight Management: Develop a combined nutrition and exercise program designed to reach desired caloric intake, while maintaining appropriate intake of nutrient and fiber, sodium and fats, and appropriate energy expenditure required for the weight goal.;Weight Management: Provide education and appropriate resources to help participant work on and attain dietary goals.;Weight Management/Obesity: Establish reasonable short term and long term weight goals.;Obesity: Provide education and appropriate resources to help participant work on and attain dietary goals.    Admit Weight  238 lb 9.6 oz (108.2 kg)    Goal Weight: Short  Term  225 lb (102.1 kg)    Goal Weight: Long Term  185 lb (83.9 kg)    Expected Outcomes  Short Term: Continue to assess and modify interventions until short term weight is achieved;Long Term: Adherence to nutrition and physical activity/exercise program aimed toward attainment of established weight goal;Weight Maintenance: Understanding of the daily nutrition guidelines, which includes 25-35% calories from fat, 7% or less cal from saturated fats, less than 273m cholesterol, less than 1.5gm of sodium, & 5 or more servings of fruits and vegetables daily;Weight Loss: Understanding of general recommendations for a balanced deficit meal plan, which promotes 1-2 lb weight loss per week and includes a negative energy balance of [phone] kcal/d;Understanding recommendations for meals to include 15-35% energy as protein, 25-35% energy from fat, 35-60% energy from carbohydrates, less than 2059mof dietary cholesterol, 20-35 gm of total fiber daily;Understanding of distribution of calorie intake throughout the day with the consumption of 4-5 meals/snacks    Diabetes  Yes last A1C at time of STEMI was 12.1-states it is due to being on prednisone for gout    Intervention  Provide education about signs/symptoms and action to take for hypo/hyperglycemia.;Provide education about proper nutrition, including hydration, and aerobic/resistive exercise prescription along with prescribed medications to achieve blood glucose in normal ranges: Fasting glucose 65-99 mg/dL    Expected Outcomes  Short Term: Participant verbalizes understanding of the signs/symptoms and immediate care of hyper/hypoglycemia, proper foot care and importance of medication, aerobic/resistive exercise and nutrition plan  for blood glucose control.;Long Term: Attainment of HbA1C < 7%.    Hypertension  Yes    Intervention  Provide education on lifestyle modifcations including regular physical activity/exercise, weight management, moderate sodium restriction  and increased consumption of fresh fruit, vegetables, and low fat dairy, alcohol moderation, and smoking cessation.;Monitor prescription use compliance.    Expected Outcomes  Short Term: Continued assessment and intervention until BP is < 140/76m HG in hypertensive participants. < 130/882mHG in hypertensive participants with diabetes, heart failure or chronic kidney disease.;Long Term: Maintenance of blood pressure at goal levels.    Lipids  Yes    Intervention  Provide education and support for participant on nutrition & aerobic/resistive exercise along with prescribed medications to achieve LDL <7065mHDL >52m35m  Expected Outcomes  Short Term: Participant states understanding of desired cholesterol values and is compliant with medications prescribed. Participant is following exercise prescription and nutrition guidelines.;Long Term: Cholesterol controlled with medications as prescribed, with individualized exercise RX and with personalized nutrition plan. Value goals: LDL < 70mg74mL > 40 mg.    Stress  Yes    Intervention  Offer individual and/or small group education and counseling on adjustment to heart disease, stress management and health-related lifestyle change. Teach and support self-help strategies.;Refer participants experiencing significant psychosocial distress to appropriate mental health specialists for further evaluation and treatment. When possible, include family members and significant others in education/counseling sessions.    Expected Outcomes  Long Term: Emotional wellbeing is indicated by absence of clinically significant psychosocial distress or social isolation.;Short Term: Participant demonstrates changes in health-related behavior, relaxation and other stress management skills, ability to obtain effective social support, and compliance with psychotropic medications if prescribed.    Personal Goal Other  Yes    Personal Goal  Getting back to an exercise routine and being able  to golf again.    Intervention  Patient will attend classes and implement a home exercise routine to build strength and stamina.     Expected Outcomes  Patient will continue with home exercise or join a gym by the completion of [facility] program and be able to return to golfing as an extra stress relieving activity.         Core Components/Risk Factors/Patient Goals Review:    Core Components/Risk Factors/Patient Goals at Discharge (Final Review):    ITP Comments: ITP Comments    Row Name 07/08/17 1227           ITP Comments  Medical Review Completed; initial ITP created. [facility] Everywhere Dr. NavarMathis Bud encounter dated 06/24/2017.          Comments: Initial ITP

## 2017-07-08 NOTE — Patient Instructions (Signed)
Patient Instructions  Patient Details  Name: Anthony Mitchell MRN: [id number] Date of Birth: 07-14-1975 Referring Provider:  Bernadette Hoit  Below are your personal goals for exercise, nutrition, and risk factors. Our goal is to help you stay on track towards obtaining and maintaining these goals. We will be discussing your progress on these goals with you throughout the program.  Initial Exercise Prescription: Initial Exercise Prescription - 07/08/17 1200      Date of Initial Exercise RX and Referring Provider   Date  07/08/17    Referring Provider  navar      Treadmill   MPH  3.2    Grade  3.5    Minutes  15    METs  4.99      Elliptical   Level  1    Speed  3    Minutes  15      REL-XR   Level  5    Watts  103    Speed  50    Minutes  15    METs  4.99      T5 Nustep   Level  3    Minutes  15    METs  4.99      Prescription Details   Frequency (times per week)  3    Duration  Progress to 45 minutes of aerobic exercise without signs/symptoms of physical distress      Intensity   THRR 40-80% of Max Heartrate  117-159    Ratings of Perceived Exertion  11-13    Perceived Dyspnea  0-4      Resistance Training   Training Prescription  Yes    Weight  5 lb    Reps  10-15       Exercise Goals: Frequency: Be able to perform aerobic exercise two to three times per week in program working toward 2-5 days per week of home exercise.  Intensity: Work with a perceived exertion of 11 (fairly light) - 15 (hard) while following your exercise prescription.  We will make changes to your prescription with you as you progress through the program.   Duration: Be able to do 30 to 45 minutes of continuous aerobic exercise in addition to a 5 minute warm-up and a 5 minute cool-down routine.   Nutrition Goals: Your personal nutrition goals will be established when you do your nutrition analysis with the dietician.  The following are general nutrition guidelines to  follow: Cholesterol < 200mg /day Sodium < 1500mg /day Fiber: Men under 50 yrs - 38 grams per day  Personal Goals: Personal Goals and Risk Factors at Admission - 07/08/17 1239      Core Components/Risk Factors/Patient Goals on Admission    Weight Management  Yes;Weight Loss;Obesity    Intervention  Weight Management: Develop a combined nutrition and exercise program designed to reach desired caloric intake, while maintaining appropriate intake of nutrient and fiber, sodium and fats, and appropriate energy expenditure required for the weight goal.;Weight Management: Provide education and appropriate resources to help participant work on and attain dietary goals.;Weight Management/Obesity: Establish reasonable short term and long term weight goals.;Obesity: Provide education and appropriate resources to help participant work on and attain dietary goals.    Admit Weight  238 lb 9.6 oz (108.2 kg)    Goal Weight: Short Term  225 lb (102.1 kg)    Goal Weight: Long Term  185 lb (83.9 kg)    Expected Outcomes  Short Term: Continue to assess and modify interventions  until short term weight is achieved;Long Term: Adherence to nutrition and physical activity/exercise program aimed toward attainment of established weight goal;Weight Maintenance: Understanding of the daily nutrition guidelines, which includes 25-35% calories from fat, 7% or less cal from saturated fats, less than 200mg  cholesterol, less than 1.5gm of sodium, & 5 or more servings of fruits and vegetables daily;Weight Loss: Understanding of general recommendations for a balanced deficit meal plan, which promotes 1-2 lb weight loss per week and includes a negative energy balance of [phone] kcal/d;Understanding recommendations for meals to include 15-35% energy as protein, 25-35% energy from fat, 35-60% energy from carbohydrates, less than 200mg  of dietary cholesterol, 20-35 gm of total fiber daily;Understanding of distribution of calorie intake  throughout the day with the consumption of 4-5 meals/snacks    Diabetes  Yes last A1C at time of STEMI was 12.1-states it is due to being on prednisone for gout    Intervention  Provide education about signs/symptoms and action to take for hypo/hyperglycemia.;Provide education about proper nutrition, including hydration, and aerobic/resistive exercise prescription along with prescribed medications to achieve blood glucose in normal ranges: Fasting glucose 65-99 mg/dL    Expected Outcomes  Short Term: Participant verbalizes understanding of the signs/symptoms and immediate care of hyper/hypoglycemia, proper foot care and importance of medication, aerobic/resistive exercise and nutrition plan for blood glucose control.;Long Term: Attainment of HbA1C < 7%.    Hypertension  Yes    Intervention  Provide education on lifestyle modifcations including regular physical activity/exercise, weight management, moderate sodium restriction and increased consumption of fresh fruit, vegetables, and low fat dairy, alcohol moderation, and smoking cessation.;Monitor prescription use compliance.    Expected Outcomes  Short Term: Continued assessment and intervention until BP is < 140/5890mm HG in hypertensive participants. < 130/9080mm HG in hypertensive participants with diabetes, heart failure or chronic kidney disease.;Long Term: Maintenance of blood pressure at goal levels.    Lipids  Yes    Intervention  Provide education and support for participant on nutrition & aerobic/resistive exercise along with prescribed medications to achieve LDL 70mg , HDL >40mg .    Expected Outcomes  Short Term: Participant states understanding of desired cholesterol values and is compliant with medications prescribed. Participant is following exercise prescription and nutrition guidelines.;Long Term: Cholesterol controlled with medications as prescribed, with individualized exercise RX and with personalized nutrition plan. Value goals: LDL < 70mg ,  HDL > 40 mg.    Stress  Yes    Intervention  Offer individual and/or small group education and counseling on adjustment to heart disease, stress management and health-related lifestyle change. Teach and support self-help strategies.;Refer participants experiencing significant psychosocial distress to appropriate mental health specialists for further evaluation and treatment. When possible, include family members and significant others in education/counseling sessions.    Expected Outcomes  Long Term: Emotional wellbeing is indicated by absence of clinically significant psychosocial distress or social isolation.;Short Term: Participant demonstrates changes in health-related behavior, relaxation and other stress management skills, ability to obtain effective social support, and compliance with psychotropic medications if prescribed.    Personal Goal Other  Yes    Personal Goal  Getting back to an exercise routine and being able to golf again.    Intervention  Patient will attend classes and implement a home exercise routine to build strength and stamina.     Expected Outcomes  Patient will continue with home exercise or join a gym by the completion of [facility] program and be able to return to golfing as an extra stress relieving  activity.         Tobacco Use Initial Evaluation: Social History   Tobacco Use  Smoking Status Never Smoker  Smokeless Tobacco Never Used    Exercise Goals and Review: Exercise Goals    Row Name 07/08/17 1217             Exercise Goals   Increase Physical Activity  Yes       Intervention  Provide advice, education, support and counseling about physical activity/exercise needs.;Develop an individualized exercise prescription for aerobic and resistive training based on initial evaluation findings, risk stratification, comorbidities and participant's personal goals.       Expected Outcomes  Short Term: Attend rehab on a regular basis to increase amount of physical  activity.;Long Term: Add in home exercise to make exercise part of routine and to increase amount of physical activity.;Long Term: Exercising regularly at least 3-5 days a week.       Increase Strength and Stamina  Yes       Intervention  Provide advice, education, support and counseling about physical activity/exercise needs.;Develop an individualized exercise prescription for aerobic and resistive training based on initial evaluation findings, risk stratification, comorbidities and participant's personal goals.       Expected Outcomes  Short Term: Increase workloads from initial exercise prescription for resistance, speed, and METs.;Short Term: Perform resistance training exercises routinely during rehab and add in resistance training at home;Long Term: Improve cardiorespiratory fitness, muscular endurance and strength as measured by increased METs and functional capacity ( )       Able to understand and use rate of perceived exertion (RPE) scale  Yes       Intervention  Provide education and explanation on how to use RPE scale       Expected Outcomes  Short Term: Able to use RPE daily in rehab to express subjective intensity level;Long Term:  Able to use RPE to guide intensity level when exercising independently       Able to understand and use Dyspnea scale  Yes       Intervention  Provide education and explanation on how to use Dyspnea scale       Expected Outcomes  Short Term: Able to use Dyspnea scale daily in rehab to express subjective sense of shortness of breath during exertion;Long Term: Able to use Dyspnea scale to guide intensity level when exercising independently       Knowledge and understanding of Target Heart Rate Range (THRR)  Yes       Intervention  Provide education and explanation of THRR including how the numbers were predicted and where they are located for reference       Expected Outcomes  Short Term: Able to state/look up THRR;Long Term: Able to use THRR to govern intensity  when exercising independently;Short Term: Able to use daily as guideline for intensity in rehab       Able to check pulse independently  Yes       Intervention  Provide education and demonstration on how to check pulse in carotid and radial arteries.;Review the importance of being able to check your own pulse for safety during independent exercise       Expected Outcomes  Short Term: Able to explain why pulse checking is important during independent exercise;Long Term: Able to check pulse independently and accurately       Understanding of Exercise Prescription  Yes       Intervention  Provide education, explanation, and written materials on patient's  individual exercise prescription       Expected Outcomes  Short Term: Able to explain program exercise prescription;Long Term: Able to explain home exercise prescription to exercise independently          Copy of goals given to participant.

## 2017-07-14 ENCOUNTER — Encounter: Payer: BLUE CROSS/BLUE SHIELD | Admitting: *Deleted

## 2017-07-14 DIAGNOSIS — I213 ST elevation (STEMI) myocardial infarction of unspecified site: Secondary | ICD-10-CM

## 2017-07-14 DIAGNOSIS — Z955 Presence of coronary angioplasty implant and graft: Secondary | ICD-10-CM

## 2017-07-14 LAB — GLUCOSE, CAPILLARY
GLUCOSE-CAPILLARY: 150 mg/dL — AB (ref 65–99)
Glucose-Capillary: 165 mg/dL — ABNORMAL HIGH (ref 65–99)

## 2017-07-14 NOTE — Progress Notes (Signed)
Daily Session Note  Patient Details  Name: Anthony Mitchell MRN: [id number] Date of Birth: 02-26-1976 Referring Provider:     Cardiac Rehab from 07/08/2017 in [facility]  Referring Provider  navar      Encounter Date: 07/14/2017  Check In: Session Check In - 07/14/17 0914      Check-In   Location  [facility]    Staff Present  Alberteen Sam, MA, RCEP, CCRP, Exercise Physiologist;Amanda Oletta Darter, BA, ACSM CEP, Exercise Physiologist;Susanne Bice, RN, BSN, CCRP    Supervising physician immediately available to respond to emergencies  See telemetry face sheet for immediately available ER MD    Medication changes reported      No    Fall or balance concerns reported     No    Warm-up and Cool-down  Performed on first and last piece of equipment    Resistance Training Performed  Yes    VAD Patient?  No      Pain Assessment   Currently in Pain?  No/denies    Multiple Pain Sites  No          Social History   Tobacco Use  Smoking Status Never Smoker  Smokeless Tobacco Never Used    Goals Met:  Exercise tolerated well Personal goals reviewed No report of cardiac concerns or symptoms Strength training completed today  Goals Unmet:  Not Applicable  Comments: First full day of exercise!  Patient was oriented to gym and equipment including functions, settings, policies, and procedures.  Patient's individual exercise prescription and treatment plan were reviewed.  All starting workloads were established based on the results of the 6 minute walk test done at initial orientation visit.  The plan for exercise progression was also introduced and progression will be customized based on patient's performance and goals.    Dr. Emily Filbert is Medical Director for [place] and [facility].

## 2017-07-22 ENCOUNTER — Encounter: Payer: Self-pay | Admitting: *Deleted

## 2017-07-22 DIAGNOSIS — I213 ST elevation (STEMI) myocardial infarction of unspecified site: Secondary | ICD-10-CM

## 2017-07-22 DIAGNOSIS — Z955 Presence of coronary angioplasty implant and graft: Secondary | ICD-10-CM

## 2017-07-22 NOTE — Progress Notes (Signed)
Cardiac Individual Treatment Plan  Patient Details  Name: Anthony Mitchell MRN: [id number] Date of Birth: 1976/02/07 Referring Provider:     Cardiac Rehab from 07/08/2017 in [facility] Cardiac and Pulmonary Rehab  Referring Provider  navar      Initial Encounter Date:    Cardiac Rehab from 07/08/2017 in [facility] Cardiac and Pulmonary Rehab  Date  07/08/17  Referring Provider  navar      Visit Diagnosis: ST elevation myocardial infarction (STEMI), unspecified artery ([place])  S/P coronary artery stent placement  Patient's Home Medications on Admission:  Current Outpatient Medications:  .  allopurinol (ZYLOPRIM) 300 MG tablet, Take 2 tablets (600 mg total) by mouth daily., Disp: 60 tablet, Rfl: 1 .  aspirin EC 81 MG tablet, Take by mouth., Disp: , Rfl:  .  atorvastatin (LIPITOR) 80 MG tablet, Take 80 mg by mouth daily., Disp: , Rfl: 2 .  Blood Glucose Monitoring Suppl (FIFTY50 GLUCOSE METER 2.0) w/Device KIT, Use as directed, Disp: , Rfl:  .  gabapentin (NEURONTIN) 100 MG capsule, TAKE 1 CAPSULE BY MOUTH THREE TIMES A DAY, Disp: , Rfl: 11 .  ibuprofen (ADVIL,MOTRIN) 600 MG tablet, Take 1 tablet by mouth as needed., Disp: , Rfl:  .  lisinopril (PRINIVIL,ZESTRIL) 5 MG tablet, Take 5 mg by mouth daily., Disp: , Rfl: 2 .  metFORMIN (GLUCOPHAGE) 500 MG tablet, TAKE 1 TABLET (500 MG TOTAL) BY MOUTH 2 (TWO) TIMES DAILY WITH A MEAL., Disp: 60 tablet, Rfl: 0 .  metoprolol tartrate (LOPRESSOR) 50 MG tablet, Take by mouth., Disp: , Rfl:  .  Multiple Vitamin (MULTI-VITAMINS) TABS, Take by mouth., Disp: , Rfl:  .  MULTIPLE VITAMIN PO, Take 1 tablet by mouth daily., Disp: , Rfl:  .  nitroGLYCERIN (NITROSTAT) 0.4 MG SL tablet, Place under the tongue., Disp: , Rfl:  .  Omega-3 Fatty Acids (FISH OIL) 500 MG CAPS, Take by mouth., Disp: , Rfl:  .  oxyCODONE-acetaminophen (ROXICET) 5-325 MG tablet, Take 1 tablet by mouth every 6 (six) hours as needed for severe pain. (Patient not taking: Reported on 07/18/2016),  Disp: 10 tablet, Rfl: 0 .  pravastatin (PRAVACHOL) 20 MG tablet, Take 1 tablet (20 mg total) by mouth daily. (Patient not taking: Reported on 07/18/2016), Disp: 30 tablet, Rfl: 3 .  predniSONE (DELTASONE) 10 MG tablet, Take 1 tablet (10 mg total) by mouth daily., Disp: 42 tablet, Rfl: 0 .  predniSONE (DELTASONE) 10 MG tablet, 10 MG TABLETS TAPERED AS INDICATED (6, 6, 5,5, 4,4,3,3,2,2,1,1) FOR GOUTY FLARE, Disp: , Rfl:  .  Semaglutide (OZEMPIC) 0.25 or 0.5 MG/DOSE SOPN, Inject into the skin., Disp: , Rfl:  .  ticagrelor (BRILINTA) 90 MG TABS tablet, Take by mouth., Disp: , Rfl:   Past Medical History: Past Medical History:  Diagnosis Date  . Gout     Tobacco Use: Social History   Tobacco Use  Smoking Status Never Smoker  Smokeless Tobacco Never Used    Labs: Recent Review Flowsheet Data    Labs for ITP Cardiac and Pulmonary Rehab Latest Ref Rng & Units 11/09/2015 07/18/2016   Cholestrol 100 - 199 mg/dL 205(H) 261(H)   LDLCALC 0 - 99 mg/dL 123(H) Comment   HDL >39 mg/dL 37(L) 41   Trlycerides 0 - 149 mg/dL 224(H) 624(HH)   Hemoglobin A1c - 6.9(H) 8.7       Exercise Target Goals:    Exercise Program Goal: Individual exercise prescription set using results from initial 6 min walk test and THRR while considering  patient's activity barriers and safety.   Exercise Prescription Goal: Initial exercise prescription builds to 30-45 minutes a day of aerobic activity, 2-3 days per week.  Home exercise guidelines will be given to patient during program as part of exercise prescription that the participant will acknowledge.  Activity Barriers & Risk Stratification: Activity Barriers & Cardiac Risk Stratification - 07/08/17 1249      Activity Barriers & Cardiac Risk Stratification   Activity Barriers  Deconditioning;Other (comment)    Comments  gout since he was a teenager, takes prednisone with flare up.     Cardiac Risk Stratification  High       6 Minute Walk: 6 Minute Walk     Row Name 07/08/17 1218         6 Minute Walk   Distance  1700 feet     Walk Time  6 minutes     # of Rest Breaks  0     MPH  3.22     METS  4.99     RPE  11     VO2 Peak  17.47     Symptoms  Yes (comment)     Comments  muscular calf pain 2/10     Resting HR  75 bpm     Resting BP  104/68     Resting Oxygen Saturation   98 %     Exercise Oxygen Saturation  during 6 min walk  99 %     Max Ex. HR  113 bpm     Max Ex. BP  126/82     2 Minute Post BP  112/76        Oxygen Initial Assessment: Oxygen Initial Assessment - 07/08/17 1301      Home Oxygen   Sleep Oxygen Prescription  BiPAP has but since he has lost weight it does not fit and he is not having as many problems.        Oxygen Re-Evaluation:   Oxygen Discharge (Final Oxygen Re-Evaluation):   Initial Exercise Prescription: Initial Exercise Prescription - 07/08/17 1200      Date of Initial Exercise RX and Referring Provider   Date  07/08/17    Referring Provider  navar      Treadmill   MPH  3.2    Grade  3.5    Minutes  15    METs  4.99      Elliptical   Level  1    Speed  3    Minutes  15      REL-XR   Level  5    Watts  103    Speed  50    Minutes  15    METs  4.99      T5 Nustep   Level  3    Minutes  15    METs  4.99      Prescription Details   Frequency (times per week)  3    Duration  Progress to 45 minutes of aerobic exercise without signs/symptoms of physical distress      Intensity   THRR 40-80% of Max Heartrate  117-159    Ratings of Perceived Exertion  11-13    Perceived Dyspnea  0-4      Resistance Training   Training Prescription  Yes    Weight  5 lb    Reps  10-15       Perform Capillary Blood Glucose checks as needed.  Exercise Prescription Changes: Exercise Prescription  Changes    Row Name 07/08/17 1200 07/14/17 1400           Response to Exercise   Blood Pressure (Admit)  104/68  114/80      Blood Pressure (Exercise)  126/82  142/80      Blood Pressure  (Exit)  112/76  106/72      Heart Rate (Admit)  75 bpm  94 bpm      Heart Rate (Exercise)  113 bpm  140 bpm      Heart Rate (Exit)  83 bpm  101 bpm      Oxygen Saturation (Admit)  98 %  -      Oxygen Saturation (Exercise)  99 %  -      Rating of Perceived Exertion (Exercise)  11  14      Symptoms  -  none      Comments  -  first full day of exercise      Duration  -  Continue with 30 min of aerobic exercise without signs/symptoms of physical distress.      Intensity  -  THRR unchanged        Progression   Progression  -  Continue to progress workloads to maintain intensity without signs/symptoms of physical distress.      Average METs  -  4.77        Resistance Training   Training Prescription  -  Yes      Weight  -  4 lbs      Reps  -  10-15        Interval Training   Interval Training  -  No        Treadmill   MPH  -  32      Grade  -  3      Minutes  -  15      METs  -  4.77        Elliptical   Level  -  1      Speed  -  3.2      Minutes  -  15         Exercise Comments: Exercise Comments    Row Name 07/14/17 0915           Exercise Comments  First full day of exercise!  Patient was oriented to gym and equipment including functions, settings, policies, and procedures.  Patient's individual exercise prescription and treatment plan were reviewed.  All starting workloads were established based on the results of the 6 minute walk test done at initial orientation visit.  The plan for exercise progression was also introduced and progression will be customized based on patient's performance and goals.          Exercise Goals and Review: Exercise Goals    Row Name 07/08/17 1217             Exercise Goals   Increase Physical Activity  Yes       Intervention  Provide advice, education, support and counseling about physical activity/exercise needs.;Develop an individualized exercise prescription for aerobic and resistive training based on initial evaluation findings,  risk stratification, comorbidities and participant's personal goals.       Expected Outcomes  Short Term: Attend rehab on a regular basis to increase amount of physical activity.;Long Term: Add in home exercise to make exercise part of routine and to increase amount of physical activity.;Long Term: Exercising regularly at least 3-5 days  a week.       Increase Strength and Stamina  Yes       Intervention  Provide advice, education, support and counseling about physical activity/exercise needs.;Develop an individualized exercise prescription for aerobic and resistive training based on initial evaluation findings, risk stratification, comorbidities and participant's personal goals.       Expected Outcomes  Short Term: Increase workloads from initial exercise prescription for resistance, speed, and METs.;Short Term: Perform resistance training exercises routinely during rehab and add in resistance training at home;Long Term: Improve cardiorespiratory fitness, muscular endurance and strength as measured by increased METs and functional capacity (6MWT)       Able to understand and use rate of perceived exertion (RPE) scale  Yes       Intervention  Provide education and explanation on how to use RPE scale       Expected Outcomes  Short Term: Able to use RPE daily in rehab to express subjective intensity level;Long Term:  Able to use RPE to guide intensity level when exercising independently       Able to understand and use Dyspnea scale  Yes       Intervention  Provide education and explanation on how to use Dyspnea scale       Expected Outcomes  Short Term: Able to use Dyspnea scale daily in rehab to express subjective sense of shortness of breath during exertion;Long Term: Able to use Dyspnea scale to guide intensity level when exercising independently       Knowledge and understanding of Target Heart Rate Range (THRR)  Yes       Intervention  Provide education and explanation of THRR including how the  numbers were predicted and where they are located for reference       Expected Outcomes  Short Term: Able to state/look up THRR;Long Term: Able to use THRR to govern intensity when exercising independently;Short Term: Able to use daily as guideline for intensity in rehab       Able to check pulse independently  Yes       Intervention  Provide education and demonstration on how to check pulse in carotid and radial arteries.;Review the importance of being able to check your own pulse for safety during independent exercise       Expected Outcomes  Short Term: Able to explain why pulse checking is important during independent exercise;Long Term: Able to check pulse independently and accurately       Understanding of Exercise Prescription  Yes       Intervention  Provide education, explanation, and written materials on patient's individual exercise prescription       Expected Outcomes  Short Term: Able to explain program exercise prescription;Long Term: Able to explain home exercise prescription to exercise independently          Exercise Goals Re-Evaluation : Exercise Goals Re-Evaluation    Row Name 07/14/17 0915             Exercise Goal Re-Evaluation   Exercise Goals Review  Able to understand and use rate of perceived exertion (RPE) scale;Knowledge and understanding of Target Heart Rate Range (THRR);Understanding of Exercise Prescription;Increase Physical Activity       Comments  Reviewed RPE scale, THR and program prescription with pt today.  Pt voiced understanding and was given a copy of goals to take home.        Expected Outcomes  Short: Use RPE daily to regulate intensity.  Long: Follow program prescription in THR.  Discharge Exercise Prescription (Final Exercise Prescription Changes): Exercise Prescription Changes - 07/14/17 1400      Response to Exercise   Blood Pressure (Admit)  114/80    Blood Pressure (Exercise)  142/80    Blood Pressure (Exit)  106/72    Heart Rate  (Admit)  94 bpm    Heart Rate (Exercise)  140 bpm    Heart Rate (Exit)  101 bpm    Rating of Perceived Exertion (Exercise)  14    Symptoms  none    Comments  first full day of exercise    Duration  Continue with 30 min of aerobic exercise without signs/symptoms of physical distress.    Intensity  THRR unchanged      Progression   Progression  Continue to progress workloads to maintain intensity without signs/symptoms of physical distress.    Average METs  4.77      Resistance Training   Training Prescription  Yes    Weight  4 lbs    Reps  10-15      Interval Training   Interval Training  No      Treadmill   MPH  32    Grade  3    Minutes  15    METs  4.77      Elliptical   Level  1    Speed  3.2    Minutes  15       Nutrition:  Target Goals: Understanding of nutrition guidelines, daily intake of sodium <1541m, cholesterol <2078m calories 30% from fat and 7% or less from saturated fats, daily to have 5 or more servings of fruits and vegetables.  Biometrics: Pre Biometrics - 07/08/17 1216      Pre Biometrics   Height  _0  (1.778 m)    Weight  238 lb 9.6 oz (108.2 kg)    Waist Circumference  42 inches    Hip Circumference  43.5 inches    Waist to Hip Ratio  0.97 %    BMI (Calculated)  34.24    Single Leg Stand  25.3 seconds        Nutrition Therapy Plan and Nutrition Goals: Nutrition Therapy & Goals - 07/08/17 1246      Intervention Plan   Intervention  Prescribe, educate and counsel regarding individualized specific dietary modifications aiming towards targeted core components such as weight, hypertension, lipid management, diabetes, heart failure and other comorbidities.;Nutrition handout(s) given to patient.    Expected Outcomes  Short Term Goal: Understand basic principles of dietary content, such as calories, fat, sodium, cholesterol and nutrients.;Short Term Goal: A plan has been developed with personal nutrition goals set during dietitian  appointment.;Long Term Goal: Adherence to prescribed nutrition plan.       Nutrition Assessments: Nutrition Assessments - 07/08/17 1246      MEDFICTS Scores   Pre Score  -- Paperwork sent home with patient       Nutrition Goals Re-Evaluation:   Nutrition Goals Discharge (Final Nutrition Goals Re-Evaluation):   Psychosocial: Target Goals: Acknowledge presence or absence of significant depression and/or stress, maximize coping skills, provide positive support system. Participant is able to verbalize types and ability to use techniques and skills needed for reducing stress and depression.   Initial Review & Psychosocial Screening: Initial Psych Review & Screening - 07/08/17 1246      Initial Review   Current issues with  Current Stress Concerns    Source of Stress Concerns  Occupation    Comments  He has a new baby in the house 4th child born 46 month after his STEMI. 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 He was initially stressed about going back to work a couple weeks ago but states it is getting better, although he expressed desire to talk with Juliann Pulse about coping strageties to handle stressful job.       Family Dynamics   Good Support System?  Yes    Comments  spouse, family, friends      Screening Interventions   Interventions  Encouraged to exercise;Program counselor consult;To provide support and resources with identified psychosocial needs;Provide feedback about the scores to participant    Expected Outcomes  Short Term goal: Utilizing psychosocial counselor, staff and physician to assist with identification of specific Stressors or current issues interfering with healing process. Setting desired goal for each stressor or current issue identified.;Long Term Goal: Stressors or current issues are controlled or eliminated.;Short Term goal: Identification and review with participant of any Quality of Life or Depression concerns found  by scoring the questionnaire.;Long Term goal: The participant improves quality of Life and PHQ9 Scores as seen by post scores and/or verbalization of changes       Quality of Life Scores:  Quality of Life - 07/08/17 1249      Quality of Life Scores   Health/Function Pre  26.8 %    Socioeconomic Pre  26.57 %    Psych/Spiritual Pre  25.71 %    Family Pre  30 %    GLOBAL Pre  27 %      Scores of 19 and below usually indicate a poorer quality of life in these areas.  A difference of  2-3 points is a clinically meaningful difference.  A difference of 2-3 points in the total score of the Quality of Life Index has been associated with significant improvement in overall quality of life, self-image, physical symptoms, and general health in studies assessing change in quality of life.  PHQ-9: Recent Review Flowsheet Data    Depression screen [facility] 2/9 07/08/2017 11/09/2015   Decreased Interest 0 0   Down, Depressed, Hopeless 0 0   PHQ - 2 Score 0 0   Altered sleeping 0 -   Tired, decreased energy 1 -   Change in appetite 0 -   Feeling bad or failure about yourself  1 -   Trouble concentrating 0 -   Moving slowly or fidgety/restless 0 -   Suicidal thoughts 0 -   PHQ-9 Score 2 -   Difficult doing work/chores Somewhat difficult -     Interpretation of Total Score  Total Score Depression Severity:  1-4 = Minimal depression, 5-9 = Mild depression, 10-14 = Moderate depression, 15-19 = Moderately severe depression, 20-27 = Severe depression   Psychosocial Evaluation and Intervention:   Psychosocial Re-Evaluation:   Psychosocial Discharge (Final Psychosocial Re-Evaluation):   Vocational Rehabilitation: Provide vocational rehab assistance to qualifying candidates.   Vocational Rehab Evaluation & Intervention: Vocational Rehab - 07/08/17 1252      Initial Vocational Rehab Evaluation & Intervention   Assessment shows need for Vocational Rehabilitation  No       Education: Education  Goals: Education classes will be provided on a variety of topics geared toward better understanding of heart health and risk factor modification. Participant will state understanding/return demonstration of topics presented as noted by education test scores.  Learning Barriers/Preferences: Learning Barriers/Preferences - 07/08/17  [place] Barriers/Preferences   Learning Barriers  None    Learning Preferences  Individual Instruction;Skilled Demonstration hands on learning       Education Topics:  AED/CPR: - Group verbal and written instruction with the use of models to demonstrate the basic use of the AED with the basic ABC's of resuscitation.   General Nutrition Guidelines/Fats and Fiber: -Group instruction provided by verbal, written material, models and posters to present the general guidelines for heart healthy nutrition. Gives an explanation and review of dietary fats and fiber.   Controlling Sodium/Reading Food Labels: -Group verbal and written material supporting the discussion of sodium use in heart healthy nutrition. Review and explanation with models, verbal and written materials for utilization of the food label.   Exercise Physiology & General Exercise Guidelines: - Group verbal and written instruction with models to review the exercise physiology of the cardiovascular system and associated critical values. Provides general exercise guidelines with specific guidelines to those with heart or lung disease.    Aerobic Exercise & Resistance Training: - Gives group verbal and written instruction on the various components of exercise. Focuses on aerobic and resistive training programs and the benefits of this training and how to safely progress through these programs..   Flexibility, Balance, Mind/Body Relaxation: Provides group verbal/written instruction on the benefits of flexibility and balance training, including mind/body exercise modes such as yoga, pilates and  tai chi.  Demonstration and skill practice provided.   Stress and Anxiety: - Provides group verbal and written instruction about the health risks of elevated stress and causes of high stress.  Discuss the correlation between heart/lung disease and anxiety and treatment options. Review healthy ways to manage with stress and anxiety.   Cardiac Rehab from 07/14/2017 in [facility] Cardiac and Pulmonary Rehab  Date  07/14/17  Educator  [facility]  Instruction Review Code  1- Verbalizes Understanding      Depression: - Provides group verbal and written instruction on the correlation between heart/lung disease and depressed mood, treatment options, and the stigmas associated with seeking treatment.   Anatomy & Physiology of the Heart: - Group verbal and written instruction and models provide basic cardiac anatomy and physiology, with the coronary electrical and arterial systems. Review of Valvular disease and Heart Failure   Cardiac Procedures: - Group verbal and written instruction to review commonly prescribed medications for heart disease. Reviews the medication, class of the drug, and side effects. Includes the steps to properly store meds and maintain the prescription regimen. (beta blockers and nitrates)   Cardiac Medications I: - Group verbal and written instruction to review commonly prescribed medications for heart disease. Reviews the medication, class of the drug, and side effects. Includes the steps to properly store meds and maintain the prescription regimen.   Cardiac Medications II: -Group verbal and written instruction to review commonly prescribed medications for heart disease. Reviews the medication, class of the drug, and side effects. (all other drug classes)    Go Sex-Intimacy & Heart Disease, Get SMART - Goal Setting: - Group verbal and written instruction through game format to discuss heart disease and the return to sexual intimacy. Provides group verbal and written material to  discuss and apply goal setting through the application of the S.M.A.R.T. Method.   Other Matters of the Heart: - Provides group verbal, written materials and models to describe Stable Angina and Peripheral Artery. Includes description of the disease process and treatment options available to the cardiac patient.  Exercise & Equipment Safety: - Individual verbal instruction and demonstration of equipment use and safety with use of the equipment.   Cardiac Rehab from 07/14/2017 in [facility] Cardiac and Pulmonary Rehab  Date  07/08/17  Educator  [place]  Instruction Review Code  1- Verbalizes Understanding      Infection Prevention: - Provides verbal and written material to individual with discussion of infection control including proper hand washing and proper equipment cleaning during exercise session.   Cardiac Rehab from 07/14/2017 in [facility] Cardiac and Pulmonary Rehab  Date  07/08/17  Educator  [place]  Instruction Review Code  1- Verbalizes Understanding      Falls Prevention: - Provides verbal and written material to individual with discussion of falls prevention and safety.   Cardiac Rehab from 07/14/2017 in [facility] Cardiac and Pulmonary Rehab  Date  07/08/17  Educator  [place]  Instruction Review Code  1- Verbalizes Understanding      Diabetes: - Individual verbal and written instruction to review signs/symptoms of diabetes, desired ranges of glucose level fasting, after meals and with exercise. Acknowledge that pre and post exercise glucose checks will be done for 3 sessions at entry of program.   Know Your Numbers and Risk Factors: -Group verbal and written instruction about important numbers in your health.  Discussion of what are risk factors and how they play a role in the disease process.  Review of Cholesterol, Blood Pressure, Diabetes, and BMI and the role they play in your overall health.   Sleep Hygiene: -Provides group verbal and written instruction about how sleep can affect your  health.  Define sleep hygiene, discuss sleep cycles and impact of sleep habits. Review good sleep hygiene tips.    Other: -Provides group and verbal instruction on various topics (see comments)   Knowledge Questionnaire Score: Knowledge Questionnaire Score - 07/08/17 1252      Knowledge Questionnaire Score   Pre Score  25/28 Reviewed correct answers with patient who verbalized understanding.       Core Components/Risk Factors/Patient Goals at Admission: Personal Goals and Risk Factors at Admission - 07/08/17 1239      Core Components/Risk Factors/Patient Goals on Admission    Weight Management  Yes;Weight Loss;Obesity    Intervention  Weight Management: Develop a combined nutrition and exercise program designed to reach desired caloric intake, while maintaining appropriate intake of nutrient and fiber, sodium and fats, and appropriate energy expenditure required for the weight goal.;Weight Management: Provide education and appropriate resources to help participant work on and attain dietary goals.;Weight Management/Obesity: Establish reasonable short term and long term weight goals.;Obesity: Provide education and appropriate resources to help participant work on and attain dietary goals.    Admit Weight  238 lb 9.6 oz (108.2 kg)    Goal Weight: Short Term  225 lb (102.1 kg)    Goal Weight: Long Term  185 lb (83.9 kg)    Expected Outcomes  Short Term: Continue to assess and modify interventions until short term weight is achieved;Long Term: Adherence to nutrition and physical activity/exercise program aimed toward attainment of established weight goal;Weight Maintenance: Understanding of the daily nutrition guidelines, which includes 25-35% calories from fat, 7% or less cal from saturated fats, less than 253m cholesterol, less than 1.5gm of sodium, & 5 or more servings of fruits and vegetables daily;Weight Loss: Understanding of general recommendations for a balanced deficit meal plan, which  promotes 1-2 lb weight loss per week and includes a negative energy balance of [phone] kcal/d;Understanding recommendations for  meals to include 15-35% energy as protein, 25-35% energy from fat, 35-60% energy from carbohydrates, less than 266m of dietary cholesterol, 20-35 gm of total fiber daily;Understanding of distribution of calorie intake throughout the day with the consumption of 4-5 meals/snacks    Diabetes  Yes last A1C at time of STEMI was 12.1-states it is due to being on prednisone for gout    Intervention  Provide education about signs/symptoms and action to take for hypo/hyperglycemia.;Provide education about proper nutrition, including hydration, and aerobic/resistive exercise prescription along with prescribed medications to achieve blood glucose in normal ranges: Fasting glucose 65-99 mg/dL    Expected Outcomes  Short Term: Participant verbalizes understanding of the signs/symptoms and immediate care of hyper/hypoglycemia, proper foot care and importance of medication, aerobic/resistive exercise and nutrition plan for blood glucose control.;Long Term: Attainment of HbA1C < 7%.    Hypertension  Yes    Intervention  Provide education on lifestyle modifcations including regular physical activity/exercise, weight management, moderate sodium restriction and increased consumption of fresh fruit, vegetables, and low fat dairy, alcohol moderation, and smoking cessation.;Monitor prescription use compliance.    Expected Outcomes  Short Term: Continued assessment and intervention until BP is < 140/980mHG in hypertensive participants. < 130/8024mG in hypertensive participants with diabetes, heart failure or chronic kidney disease.;Long Term: Maintenance of blood pressure at goal levels.    Lipids  Yes    Intervention  Provide education and support for participant on nutrition & aerobic/resistive exercise along with prescribed medications to achieve LDL <38m27mDL >40mg64m Expected Outcomes  Short  Term: Participant states understanding of desired cholesterol values and is compliant with medications prescribed. Participant is following exercise prescription and nutrition guidelines.;Long Term: Cholesterol controlled with medications as prescribed, with individualized exercise RX and with personalized nutrition plan. Value goals: LDL < 38mg,75m > 40 mg.    Stress  Yes    Intervention  Offer individual and/or small group education and counseling on adjustment to heart disease, stress management and health-related lifestyle change. Teach and support self-help strategies.;Refer participants experiencing significant psychosocial distress to appropriate mental health specialists for further evaluation and treatment. When possible, include family members and significant others in education/counseling sessions.    Expected Outcomes  Long Term: Emotional wellbeing is indicated by absence of clinically significant psychosocial distress or social isolation.;Short Term: Participant demonstrates changes in health-related behavior, relaxation and other stress management skills, ability to obtain effective social support, and compliance with psychotropic medications if prescribed.    Personal Goal Other  Yes    Personal Goal  Getting back to an exercise routine and being able to golf again.    Intervention  Patient will attend classes and implement a home exercise routine to build strength and stamina.     Expected Outcomes  Patient will continue with home exercise or join a gym by the completion of [facility] program and be able to return to golfing as an extra stress relieving activity.         Core Components/Risk Factors/Patient Goals Review:    Core Components/Risk Factors/Patient Goals at Discharge (Final Review):    ITP Comments: ITP Comments    Row Name 07/08/17 1227 07/22/17 0606         ITP Comments  Medical Review Completed; initial ITP created. Diagnosis [facility]where [facility] Navar Mathis [facility] dated 06/24/2017.  30 Day review. Continue with ITP unless directed changes per Medical Director review.  New  to program         Comments:

## 2017-07-28 ENCOUNTER — Telehealth: Payer: Self-pay | Admitting: *Deleted

## 2017-07-28 ENCOUNTER — Encounter: Payer: Self-pay | Admitting: *Deleted

## 2017-07-28 DIAGNOSIS — I213 ST elevation (STEMI) myocardial infarction of unspecified site: Secondary | ICD-10-CM

## 2017-07-28 DIAGNOSIS — Z955 Presence of coronary angioplasty implant and graft: Secondary | ICD-10-CM

## 2017-07-28 NOTE — Telephone Encounter (Signed)
Called to check on status of return to program.  He has been going to gym on his own.  He is doing on the treadmill and doing weights at the gym. He feels confident it with going to gym and has been working in cardiac for years.  Anthony Mitchell would like to discharge from the program at this time.

## 2017-07-28 NOTE — Progress Notes (Signed)
Cardiac Individual Treatment Plan  Patient Details  Name: Anthony Mitchell MRN: [id number] Date of Birth: 1976/02/07 Referring Provider:     Cardiac Rehab from 07/08/2017 in [facility] Cardiac and Pulmonary Rehab  Referring Provider  navar      Initial Encounter Date:    Cardiac Rehab from 07/08/2017 in [facility] Cardiac and Pulmonary Rehab  Date  07/08/17  Referring Provider  navar      Visit Diagnosis: ST elevation myocardial infarction (STEMI), unspecified artery ([place])  S/P coronary artery stent placement  Patient's Home Medications on Admission:  Current Outpatient Medications:  .  allopurinol (ZYLOPRIM) 300 MG tablet, Take 2 tablets (600 mg total) by mouth daily., Disp: 60 tablet, Rfl: 1 .  aspirin EC 81 MG tablet, Take by mouth., Disp: , Rfl:  .  atorvastatin (LIPITOR) 80 MG tablet, Take 80 mg by mouth daily., Disp: , Rfl: 2 .  Blood Glucose Monitoring Suppl (FIFTY50 GLUCOSE METER 2.0) w/Device KIT, Use as directed, Disp: , Rfl:  .  gabapentin (NEURONTIN) 100 MG capsule, TAKE 1 CAPSULE BY MOUTH THREE TIMES A DAY, Disp: , Rfl: 11 .  ibuprofen (ADVIL,MOTRIN) 600 MG tablet, Take 1 tablet by mouth as needed., Disp: , Rfl:  .  lisinopril (PRINIVIL,ZESTRIL) 5 MG tablet, Take 5 mg by mouth daily., Disp: , Rfl: 2 .  metFORMIN (GLUCOPHAGE) 500 MG tablet, TAKE 1 TABLET (500 MG TOTAL) BY MOUTH 2 (TWO) TIMES DAILY WITH A MEAL., Disp: 60 tablet, Rfl: 0 .  metoprolol tartrate (LOPRESSOR) 50 MG tablet, Take by mouth., Disp: , Rfl:  .  Multiple Vitamin (MULTI-VITAMINS) TABS, Take by mouth., Disp: , Rfl:  .  MULTIPLE VITAMIN PO, Take 1 tablet by mouth daily., Disp: , Rfl:  .  nitroGLYCERIN (NITROSTAT) 0.4 MG SL tablet, Place under the tongue., Disp: , Rfl:  .  Omega-3 Fatty Acids (FISH OIL) 500 MG CAPS, Take by mouth., Disp: , Rfl:  .  oxyCODONE-acetaminophen (ROXICET) 5-325 MG tablet, Take 1 tablet by mouth every 6 (six) hours as needed for severe pain. (Patient not taking: Reported on 07/18/2016),  Disp: 10 tablet, Rfl: 0 .  pravastatin (PRAVACHOL) 20 MG tablet, Take 1 tablet (20 mg total) by mouth daily. (Patient not taking: Reported on 07/18/2016), Disp: 30 tablet, Rfl: 3 .  predniSONE (DELTASONE) 10 MG tablet, Take 1 tablet (10 mg total) by mouth daily., Disp: 42 tablet, Rfl: 0 .  predniSONE (DELTASONE) 10 MG tablet, 10 MG TABLETS TAPERED AS INDICATED (6, 6, 5,5, 4,4,3,3,2,2,1,1) FOR GOUTY FLARE, Disp: , Rfl:  .  Semaglutide (OZEMPIC) 0.25 or 0.5 MG/DOSE SOPN, Inject into the skin., Disp: , Rfl:  .  ticagrelor (BRILINTA) 90 MG TABS tablet, Take by mouth., Disp: , Rfl:   Past Medical History: Past Medical History:  Diagnosis Date  . Gout     Tobacco Use: Social History   Tobacco Use  Smoking Status Never Smoker  Smokeless Tobacco Never Used    Labs: Recent Review Flowsheet Data    Labs for ITP Cardiac and Pulmonary Rehab Latest Ref Rng & Units 11/09/2015 07/18/2016   Cholestrol 100 - 199 mg/dL 205(H) 261(H)   LDLCALC 0 - 99 mg/dL 123(H) Comment   HDL >39 mg/dL 37(L) 41   Trlycerides 0 - 149 mg/dL 224(H) 624(HH)   Hemoglobin A1c - 6.9(H) 8.7       Exercise Target Goals:    Exercise Program Goal: Individual exercise prescription set using results from initial 6 min walk test and THRR while considering  patient's activity barriers and safety.   Exercise Prescription Goal: Initial exercise prescription builds to 30-45 minutes a day of aerobic activity, 2-3 days per week.  Home exercise guidelines will be given to patient during program as part of exercise prescription that the participant will acknowledge.  Activity Barriers & Risk Stratification: Activity Barriers & Cardiac Risk Stratification - 07/08/17 1249      Activity Barriers & Cardiac Risk Stratification   Activity Barriers  Deconditioning;Other (comment)    Comments  gout since he was a teenager, takes prednisone with flare up.     Cardiac Risk Stratification  High       6 Minute Walk: 6 Minute Walk     Row Name 07/08/17 1218         6 Minute Walk   Distance  1700 feet     Walk Time  6 minutes     # of Rest Breaks  0     MPH  3.22     METS  4.99     RPE  11     VO2 Peak  17.47     Symptoms  Yes (comment)     Comments  muscular calf pain 2/10     Resting HR  75 bpm     Resting BP  104/68     Resting Oxygen Saturation   98 %     Exercise Oxygen Saturation  during 6 min walk  99 %     Max Ex. HR  113 bpm     Max Ex. BP  126/82     2 Minute Post BP  112/76        Oxygen Initial Assessment: Oxygen Initial Assessment - 07/08/17 1301      Home Oxygen   Sleep Oxygen Prescription  BiPAP has but since he has lost weight it does not fit and he is not having as many problems.        Oxygen Re-Evaluation:   Oxygen Discharge (Final Oxygen Re-Evaluation):   Initial Exercise Prescription: Initial Exercise Prescription - 07/08/17 1200      Date of Initial Exercise RX and Referring Provider   Date  07/08/17    Referring Provider  navar      Treadmill   MPH  3.2    Grade  3.5    Minutes  15    METs  4.99      Elliptical   Level  1    Speed  3    Minutes  15      REL-XR   Level  5    Watts  103    Speed  50    Minutes  15    METs  4.99      T5 Nustep   Level  3    Minutes  15    METs  4.99      Prescription Details   Frequency (times per week)  3    Duration  Progress to 45 minutes of aerobic exercise without signs/symptoms of physical distress      Intensity   THRR 40-80% of Max Heartrate  117-159    Ratings of Perceived Exertion  11-13    Perceived Dyspnea  0-4      Resistance Training   Training Prescription  Yes    Weight  5 lb    Reps  10-15       Perform Capillary Blood Glucose checks as needed.  Exercise Prescription Changes: Exercise Prescription  Changes    Row Name 07/08/17 1200 07/14/17 1400           Response to Exercise   Blood Pressure (Admit)  104/68  114/80      Blood Pressure (Exercise)  126/82  142/80      Blood Pressure  (Exit)  112/76  106/72      Heart Rate (Admit)  75 bpm  94 bpm      Heart Rate (Exercise)  113 bpm  140 bpm      Heart Rate (Exit)  83 bpm  101 bpm      Oxygen Saturation (Admit)  98 %  -      Oxygen Saturation (Exercise)  99 %  -      Rating of Perceived Exertion (Exercise)  11  14      Symptoms  -  none      Comments  -  first full day of exercise      Duration  -  Continue with 30 min of aerobic exercise without signs/symptoms of physical distress.      Intensity  -  THRR unchanged        Progression   Progression  -  Continue to progress workloads to maintain intensity without signs/symptoms of physical distress.      Average METs  -  4.77        Resistance Training   Training Prescription  -  Yes      Weight  -  4 lbs      Reps  -  10-15        Interval Training   Interval Training  -  No        Treadmill   MPH  -  32      Grade  -  3      Minutes  -  15      METs  -  4.77        Elliptical   Level  -  1      Speed  -  3.2      Minutes  -  15         Exercise Comments: Exercise Comments    Row Name 07/14/17 0915           Exercise Comments  First full day of exercise!  Patient was oriented to gym and equipment including functions, settings, policies, and procedures.  Patient's individual exercise prescription and treatment plan were reviewed.  All starting workloads were established based on the results of the 6 minute walk test done at initial orientation visit.  The plan for exercise progression was also introduced and progression will be customized based on patient's performance and goals.          Exercise Goals and Review: Exercise Goals    Row Name 07/08/17 1217             Exercise Goals   Increase Physical Activity  Yes       Intervention  Provide advice, education, support and counseling about physical activity/exercise needs.;Develop an individualized exercise prescription for aerobic and resistive training based on initial evaluation findings,  risk stratification, comorbidities and participant's personal goals.       Expected Outcomes  Short Term: Attend rehab on a regular basis to increase amount of physical activity.;Long Term: Add in home exercise to make exercise part of routine and to increase amount of physical activity.;Long Term: Exercising regularly at least 3-5 days  a week.       Increase Strength and Stamina  Yes       Intervention  Provide advice, education, support and counseling about physical activity/exercise needs.;Develop an individualized exercise prescription for aerobic and resistive training based on initial evaluation findings, risk stratification, comorbidities and participant's personal goals.       Expected Outcomes  Short Term: Increase workloads from initial exercise prescription for resistance, speed, and METs.;Short Term: Perform resistance training exercises routinely during rehab and add in resistance training at home;Long Term: Improve cardiorespiratory fitness, muscular endurance and strength as measured by increased METs and functional capacity (6MWT)       Able to understand and use rate of perceived exertion (RPE) scale  Yes       Intervention  Provide education and explanation on how to use RPE scale       Expected Outcomes  Short Term: Able to use RPE daily in rehab to express subjective intensity level;Long Term:  Able to use RPE to guide intensity level when exercising independently       Able to understand and use Dyspnea scale  Yes       Intervention  Provide education and explanation on how to use Dyspnea scale       Expected Outcomes  Short Term: Able to use Dyspnea scale daily in rehab to express subjective sense of shortness of breath during exertion;Long Term: Able to use Dyspnea scale to guide intensity level when exercising independently       Knowledge and understanding of Target Heart Rate Range (THRR)  Yes       Intervention  Provide education and explanation of THRR including how the  numbers were predicted and where they are located for reference       Expected Outcomes  Short Term: Able to state/look up THRR;Long Term: Able to use THRR to govern intensity when exercising independently;Short Term: Able to use daily as guideline for intensity in rehab       Able to check pulse independently  Yes       Intervention  Provide education and demonstration on how to check pulse in carotid and radial arteries.;Review the importance of being able to check your own pulse for safety during independent exercise       Expected Outcomes  Short Term: Able to explain why pulse checking is important during independent exercise;Long Term: Able to check pulse independently and accurately       Understanding of Exercise Prescription  Yes       Intervention  Provide education, explanation, and written materials on patient's individual exercise prescription       Expected Outcomes  Short Term: Able to explain program exercise prescription;Long Term: Able to explain home exercise prescription to exercise independently          Exercise Goals Re-Evaluation : Exercise Goals Re-Evaluation    Row Name 07/14/17 0915             Exercise Goal Re-Evaluation   Exercise Goals Review  Able to understand and use rate of perceived exertion (RPE) scale;Knowledge and understanding of Target Heart Rate Range (THRR);Understanding of Exercise Prescription;Increase Physical Activity       Comments  Reviewed RPE scale, THR and program prescription with pt today.  Pt voiced understanding and was given a copy of goals to take home.        Expected Outcomes  Short: Use RPE daily to regulate intensity.  Long: Follow program prescription in THR.  Discharge Exercise Prescription (Final Exercise Prescription Changes): Exercise Prescription Changes - 07/14/17 1400      Response to Exercise   Blood Pressure (Admit)  114/80    Blood Pressure (Exercise)  142/80    Blood Pressure (Exit)  106/72    Heart Rate  (Admit)  94 bpm    Heart Rate (Exercise)  140 bpm    Heart Rate (Exit)  101 bpm    Rating of Perceived Exertion (Exercise)  14    Symptoms  none    Comments  first full day of exercise    Duration  Continue with 30 min of aerobic exercise without signs/symptoms of physical distress.    Intensity  THRR unchanged      Progression   Progression  Continue to progress workloads to maintain intensity without signs/symptoms of physical distress.    Average METs  4.77      Resistance Training   Training Prescription  Yes    Weight  4 lbs    Reps  10-15      Interval Training   Interval Training  No      Treadmill   MPH  32    Grade  3    Minutes  15    METs  4.77      Elliptical   Level  1    Speed  3.2    Minutes  15       Nutrition:  Target Goals: Understanding of nutrition guidelines, daily intake of sodium <1541m, cholesterol <2078m calories 30% from fat and 7% or less from saturated fats, daily to have 5 or more servings of fruits and vegetables.  Biometrics: Pre Biometrics - 07/08/17 1216      Pre Biometrics   Height  _0  (1.778 m)    Weight  238 lb 9.6 oz (108.2 kg)    Waist Circumference  42 inches    Hip Circumference  43.5 inches    Waist to Hip Ratio  0.97 %    BMI (Calculated)  34.24    Single Leg Stand  25.3 seconds        Nutrition Therapy Plan and Nutrition Goals: Nutrition Therapy & Goals - 07/08/17 1246      Intervention Plan   Intervention  Prescribe, educate and counsel regarding individualized specific dietary modifications aiming towards targeted core components such as weight, hypertension, lipid management, diabetes, heart failure and other comorbidities.;Nutrition handout(s) given to patient.    Expected Outcomes  Short Term Goal: Understand basic principles of dietary content, such as calories, fat, sodium, cholesterol and nutrients.;Short Term Goal: A plan has been developed with personal nutrition goals set during dietitian  appointment.;Long Term Goal: Adherence to prescribed nutrition plan.       Nutrition Assessments: Nutrition Assessments - 07/08/17 1246      MEDFICTS Scores   Pre Score  -- Paperwork sent home with patient       Nutrition Goals Re-Evaluation:   Nutrition Goals Discharge (Final Nutrition Goals Re-Evaluation):   Psychosocial: Target Goals: Acknowledge presence or absence of significant depression and/or stress, maximize coping skills, provide positive support system. Participant is able to verbalize types and ability to use techniques and skills needed for reducing stress and depression.   Initial Review & Psychosocial Screening: Initial Psych Review & Screening - 07/08/17 1246      Initial Review   Current issues with  Current Stress Concerns    Source of Stress Concerns  Occupation    Comments  He has a new baby in the house 4th child born 46 month after his STEMI. 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 He was initially stressed about going back to work a couple weeks ago but states it is getting better, although he expressed desire to talk with Juliann Pulse about coping strageties to handle stressful job.       Family Dynamics   Good Support System?  Yes    Comments  spouse, family, friends      Screening Interventions   Interventions  Encouraged to exercise;Program counselor consult;To provide support and resources with identified psychosocial needs;Provide feedback about the scores to participant    Expected Outcomes  Short Term goal: Utilizing psychosocial counselor, staff and physician to assist with identification of specific Stressors or current issues interfering with healing process. Setting desired goal for each stressor or current issue identified.;Long Term Goal: Stressors or current issues are controlled or eliminated.;Short Term goal: Identification and review with participant of any Quality of Life or Depression concerns found  by scoring the questionnaire.;Long Term goal: The participant improves quality of Life and PHQ9 Scores as seen by post scores and/or verbalization of changes       Quality of Life Scores:  Quality of Life - 07/08/17 1249      Quality of Life Scores   Health/Function Pre  26.8 %    Socioeconomic Pre  26.57 %    Psych/Spiritual Pre  25.71 %    Family Pre  30 %    GLOBAL Pre  27 %      Scores of 19 and below usually indicate a poorer quality of life in these areas.  A difference of  2-3 points is a clinically meaningful difference.  A difference of 2-3 points in the total score of the Quality of Life Index has been associated with significant improvement in overall quality of life, self-image, physical symptoms, and general health in studies assessing change in quality of life.  PHQ-9: Recent Review Flowsheet Data    Depression screen [facility] 2/9 07/08/2017 11/09/2015   Decreased Interest 0 0   Down, Depressed, Hopeless 0 0   PHQ - 2 Score 0 0   Altered sleeping 0 -   Tired, decreased energy 1 -   Change in appetite 0 -   Feeling bad or failure about yourself  1 -   Trouble concentrating 0 -   Moving slowly or fidgety/restless 0 -   Suicidal thoughts 0 -   PHQ-9 Score 2 -   Difficult doing work/chores Somewhat difficult -     Interpretation of Total Score  Total Score Depression Severity:  1-4 = Minimal depression, 5-9 = Mild depression, 10-14 = Moderate depression, 15-19 = Moderately severe depression, 20-27 = Severe depression   Psychosocial Evaluation and Intervention:   Psychosocial Re-Evaluation:   Psychosocial Discharge (Final Psychosocial Re-Evaluation):   Vocational Rehabilitation: Provide vocational rehab assistance to qualifying candidates.   Vocational Rehab Evaluation & Intervention: Vocational Rehab - 07/08/17 1252      Initial Vocational Rehab Evaluation & Intervention   Assessment shows need for Vocational Rehabilitation  No       Education: Education  Goals: Education classes will be provided on a variety of topics geared toward better understanding of heart health and risk factor modification. Participant will state understanding/return demonstration of topics presented as noted by education test scores.  Learning Barriers/Preferences: Learning Barriers/Preferences - 07/08/17  [place] Barriers/Preferences   Learning Barriers  None    Learning Preferences  Individual Instruction;Skilled Demonstration hands on learning       Education Topics:  AED/CPR: - Group verbal and written instruction with the use of models to demonstrate the basic use of the AED with the basic ABC's of resuscitation.   General Nutrition Guidelines/Fats and Fiber: -Group instruction provided by verbal, written material, models and posters to present the general guidelines for heart healthy nutrition. Gives an explanation and review of dietary fats and fiber.   Controlling Sodium/Reading Food Labels: -Group verbal and written material supporting the discussion of sodium use in heart healthy nutrition. Review and explanation with models, verbal and written materials for utilization of the food label.   Exercise Physiology & General Exercise Guidelines: - Group verbal and written instruction with models to review the exercise physiology of the cardiovascular system and associated critical values. Provides general exercise guidelines with specific guidelines to those with heart or lung disease.    Aerobic Exercise & Resistance Training: - Gives group verbal and written instruction on the various components of exercise. Focuses on aerobic and resistive training programs and the benefits of this training and how to safely progress through these programs..   Flexibility, Balance, Mind/Body Relaxation: Provides group verbal/written instruction on the benefits of flexibility and balance training, including mind/body exercise modes such as yoga, pilates and  tai chi.  Demonstration and skill practice provided.   Stress and Anxiety: - Provides group verbal and written instruction about the health risks of elevated stress and causes of high stress.  Discuss the correlation between heart/lung disease and anxiety and treatment options. Review healthy ways to manage with stress and anxiety.   Cardiac Rehab from 07/14/2017 in [facility] Cardiac and Pulmonary Rehab  Date  07/14/17  Educator  [facility]  Instruction Review Code  1- Verbalizes Understanding      Depression: - Provides group verbal and written instruction on the correlation between heart/lung disease and depressed mood, treatment options, and the stigmas associated with seeking treatment.   Anatomy & Physiology of the Heart: - Group verbal and written instruction and models provide basic cardiac anatomy and physiology, with the coronary electrical and arterial systems. Review of Valvular disease and Heart Failure   Cardiac Procedures: - Group verbal and written instruction to review commonly prescribed medications for heart disease. Reviews the medication, class of the drug, and side effects. Includes the steps to properly store meds and maintain the prescription regimen. (beta blockers and nitrates)   Cardiac Medications I: - Group verbal and written instruction to review commonly prescribed medications for heart disease. Reviews the medication, class of the drug, and side effects. Includes the steps to properly store meds and maintain the prescription regimen.   Cardiac Medications II: -Group verbal and written instruction to review commonly prescribed medications for heart disease. Reviews the medication, class of the drug, and side effects. (all other drug classes)    Go Sex-Intimacy & Heart Disease, Get SMART - Goal Setting: - Group verbal and written instruction through game format to discuss heart disease and the return to sexual intimacy. Provides group verbal and written material to  discuss and apply goal setting through the application of the S.M.A.R.T. Method.   Other Matters of the Heart: - Provides group verbal, written materials and models to describe Stable Angina and Peripheral Artery. Includes description of the disease process and treatment options available to the cardiac patient.  Exercise & Equipment Safety: - Individual verbal instruction and demonstration of equipment use and safety with use of the equipment.   Cardiac Rehab from 07/14/2017 in [facility] Cardiac and Pulmonary Rehab  Date  07/08/17  Educator  [place]  Instruction Review Code  1- Verbalizes Understanding      Infection Prevention: - Provides verbal and written material to individual with discussion of infection control including proper hand washing and proper equipment cleaning during exercise session.   Cardiac Rehab from 07/14/2017 in [facility] Cardiac and Pulmonary Rehab  Date  07/08/17  Educator  [place]  Instruction Review Code  1- Verbalizes Understanding      Falls Prevention: - Provides verbal and written material to individual with discussion of falls prevention and safety.   Cardiac Rehab from 07/14/2017 in [facility] Cardiac and Pulmonary Rehab  Date  07/08/17  Educator  [place]  Instruction Review Code  1- Verbalizes Understanding      Diabetes: - Individual verbal and written instruction to review signs/symptoms of diabetes, desired ranges of glucose level fasting, after meals and with exercise. Acknowledge that pre and post exercise glucose checks will be done for 3 sessions at entry of program.   Know Your Numbers and Risk Factors: -Group verbal and written instruction about important numbers in your health.  Discussion of what are risk factors and how they play a role in the disease process.  Review of Cholesterol, Blood Pressure, Diabetes, and BMI and the role they play in your overall health.   Sleep Hygiene: -Provides group verbal and written instruction about how sleep can affect your  health.  Define sleep hygiene, discuss sleep cycles and impact of sleep habits. Review good sleep hygiene tips.    Other: -Provides group and verbal instruction on various topics (see comments)   Knowledge Questionnaire Score: Knowledge Questionnaire Score - 07/08/17 1252      Knowledge Questionnaire Score   Pre Score  25/28 Reviewed correct answers with patient who verbalized understanding.       Core Components/Risk Factors/Patient Goals at Admission: Personal Goals and Risk Factors at Admission - 07/08/17 1239      Core Components/Risk Factors/Patient Goals on Admission    Weight Management  Yes;Weight Loss;Obesity    Intervention  Weight Management: Develop a combined nutrition and exercise program designed to reach desired caloric intake, while maintaining appropriate intake of nutrient and fiber, sodium and fats, and appropriate energy expenditure required for the weight goal.;Weight Management: Provide education and appropriate resources to help participant work on and attain dietary goals.;Weight Management/Obesity: Establish reasonable short term and long term weight goals.;Obesity: Provide education and appropriate resources to help participant work on and attain dietary goals.    Admit Weight  238 lb 9.6 oz (108.2 kg)    Goal Weight: Short Term  225 lb (102.1 kg)    Goal Weight: Long Term  185 lb (83.9 kg)    Expected Outcomes  Short Term: Continue to assess and modify interventions until short term weight is achieved;Long Term: Adherence to nutrition and physical activity/exercise program aimed toward attainment of established weight goal;Weight Maintenance: Understanding of the daily nutrition guidelines, which includes 25-35% calories from fat, 7% or less cal from saturated fats, less than 253m cholesterol, less than 1.5gm of sodium, & 5 or more servings of fruits and vegetables daily;Weight Loss: Understanding of general recommendations for a balanced deficit meal plan, which  promotes 1-2 lb weight loss per week and includes a negative energy balance of [phone] kcal/d;Understanding recommendations for  meals to include 15-35% energy as protein, 25-35% energy from fat, 35-60% energy from carbohydrates, less than 222m of dietary cholesterol, 20-35 gm of total fiber daily;Understanding of distribution of calorie intake throughout the day with the consumption of 4-5 meals/snacks    Diabetes  Yes last A1C at time of STEMI was 12.1-states it is due to being on prednisone for gout    Intervention  Provide education about signs/symptoms and action to take for hypo/hyperglycemia.;Provide education about proper nutrition, including hydration, and aerobic/resistive exercise prescription along with prescribed medications to achieve blood glucose in normal ranges: Fasting glucose 65-99 mg/dL    Expected Outcomes  Short Term: Participant verbalizes understanding of the signs/symptoms and immediate care of hyper/hypoglycemia, proper foot care and importance of medication, aerobic/resistive exercise and nutrition plan for blood glucose control.;Long Term: Attainment of HbA1C < 7%.    Hypertension  Yes    Intervention  Provide education on lifestyle modifcations including regular physical activity/exercise, weight management, moderate sodium restriction and increased consumption of fresh fruit, vegetables, and low fat dairy, alcohol moderation, and smoking cessation.;Monitor prescription use compliance.    Expected Outcomes  Short Term: Continued assessment and intervention until BP is < 140/933mHG in hypertensive participants. < 130/8068mG in hypertensive participants with diabetes, heart failure or chronic kidney disease.;Long Term: Maintenance of blood pressure at goal levels.    Lipids  Yes    Intervention  Provide education and support for participant on nutrition & aerobic/resistive exercise along with prescribed medications to achieve LDL <21m54mDL >40mg52m Expected Outcomes  Short  Term: Participant states understanding of desired cholesterol values and is compliant with medications prescribed. Participant is following exercise prescription and nutrition guidelines.;Long Term: Cholesterol controlled with medications as prescribed, with individualized exercise RX and with personalized nutrition plan. Value goals: LDL < 21mg,21m > 40 mg.    Stress  Yes    Intervention  Offer individual and/or small group education and counseling on adjustment to heart disease, stress management and health-related lifestyle change. Teach and support self-help strategies.;Refer participants experiencing significant psychosocial distress to appropriate mental health specialists for further evaluation and treatment. When possible, include family members and significant others in education/counseling sessions.    Expected Outcomes  Long Term: Emotional wellbeing is indicated by absence of clinically significant psychosocial distress or social isolation.;Short Term: Participant demonstrates changes in health-related behavior, relaxation and other stress management skills, ability to obtain effective social support, and compliance with psychotropic medications if prescribed.    Personal Goal Other  Yes    Personal Goal  Getting back to an exercise routine and being able to golf again.    Intervention  Patient will attend classes and implement a home exercise routine to build strength and stamina.     Expected Outcomes  Patient will continue with home exercise or join a gym by the completion of [facility] program and be able to return to golfing as an extra stress relieving activity.         Core Components/Risk Factors/Patient Goals Review:    Core Components/Risk Factors/Patient Goals at Discharge (Final Review):    ITP Comments: ITP Comments    Row Name 07/08/17 1227 07/22/17 0606 07/28/17 1457       ITP Comments  Medical Review Completed; initial ITP created. Diagnosis [facility]  [facility] Everywhere Dr. Navar Mathis Budencounter dated 06/24/2017.  30 Day review. Continue with ITP unless directed changes per Medical Director review.  New  to program  Called to check on status of return to program.  He has been going to gym on his own.  He is doing 85mn on the treadmill and doing weights at the gym. He feels confident it with going to gym and has been working in cardiac for years.  Tim would like to discharge from the program at this time.         Comments: Discharge ITP

## 2017-07-28 NOTE — Progress Notes (Signed)
Discharge Progress Report  Patient Details  Name: Anthony Mitchell MRN: [id number] Date of Birth: 03-Sep-1975 Referring Provider:     Cardiac Rehab from 07/08/2017 in [facility]  Referring Provider  navar       Number of Visits: 2/36  Reason for Discharge:  Patient reached a stable level of exercise. Patient independent in their exercise. Early Exit:  Personal  Smoking History:  Social History   Tobacco Use  Smoking Status Never Smoker  Smokeless Tobacco Never Used    Diagnosis:  ST elevation myocardial infarction (STEMI), unspecified artery (HCC)  S/P coronary artery stent placement  ADL [facility]:   Initial Exercise Prescription: Initial Exercise Prescription - 07/08/17 1200      Date of Initial Exercise RX and Referring Provider   Date  07/08/17    Referring Provider  navar      Treadmill   MPH  3.2    Grade  3.5    Minutes  15    METs  4.99      Elliptical   Level  1    Speed  3    Minutes  15      REL-XR   Level  5    Watts  103    Speed  50    Minutes  15    METs  4.99      T5 Nustep   Level  3    Minutes  15    METs  4.99      Prescription Details   Frequency (times per week)  3    Duration  Progress to 45 minutes of aerobic exercise without signs/symptoms of physical distress      Intensity   THRR 40-80% of Max Heartrate  117-159    Ratings of Perceived Exertion  11-13    Perceived Dyspnea  0-4      Resistance Training   Training Prescription  Yes    Weight  5 lb    Reps  10-15       Discharge Exercise Prescription (Final Exercise Prescription Changes): Exercise Prescription Changes - 07/14/17 1400      Response to Exercise   Blood Pressure (Admit)  114/80    Blood Pressure (Exercise)  142/80    Blood Pressure (Exit)  106/72    Heart Rate (Admit)  94 bpm    Heart Rate (Exercise)  140 bpm    Heart Rate (Exit)  101 bpm    Rating of Perceived Exertion (Exercise)  14    Symptoms  none    Comments  first  full day of exercise    Duration  Continue with 30 min of aerobic exercise without signs/symptoms of physical distress.    Intensity  THRR unchanged      Progression   Progression  Continue to progress workloads to maintain intensity without signs/symptoms of physical distress.    Average METs  4.77      Resistance Training   Training Prescription  Yes    Weight  4 lbs    Reps  10-15      Interval Training   Interval Training  No      Treadmill   MPH  32    Grade  3    Minutes  15    METs  4.77      Elliptical   Level  1    Speed  3.2    Minutes  15  Functional Capacity: 6 Minute Walk    Row Name 07/08/17 1218         6 Minute Walk   Distance  1700 feet     Walk Time  6 minutes     # of Rest Breaks  0     MPH  3.22     METS  4.99     RPE  11     VO2 Peak  17.47     Symptoms  Yes (comment)     Comments  muscular calf pain 2/10     Resting HR  75 bpm     Resting BP  104/68     Resting Oxygen Saturation   98 %     Exercise Oxygen Saturation  during 6 min walk  99 %     Max Ex. HR  113 bpm     Max Ex. BP  126/82     2 Minute Post BP  112/76        Psychological, QOL, Others - Outcomes: PHQ 2/9: [facility] 07/08[facility]2017 11/09/2015  Decreased Interest 0 0  Down, Depressed, Hopeless 0 0  PHQ - 2 Score 0 0  Altered sleeping 0 -  Tired, decreased energy 1 -  Change in appetite 0 -  Feeling bad or failure about yourself  1 -  Trouble concentrating 0 -  Moving slowly or fidgety/restless 0 -  Suicidal thoughts 0 -  PHQ-9 Score 2 -  Difficult doing work/chores Somewhat difficult -    Quality of Life: Quality of Life - 07/08/17 1249      Quality of Life Scores   Health/Function Pre  26.8 %    Socioeconomic Pre  26.57 %    Psych/Spiritual Pre  25.71 %    Family Pre  30 %    GLOBAL Pre  27 %       Personal Goals: Goals established at orientation with interventions provided to work toward goal. Personal Goals and Risk Factors at  Admission - 07/08/17 1239      Core Components/Risk Factors/Patient Goals on Admission    Weight Management  Yes;Weight Loss;Obesity    Intervention  Weight Management: Develop a combined nutrition and exercise program designed to reach desired caloric intake, while maintaining appropriate intake of nutrient and fiber, sodium and fats, and appropriate energy expenditure required for the weight goal.;Weight Management: Provide education and appropriate resources to help participant work on and attain dietary goals.;Weight Management/Obesity: Establish reasonable short term and long term weight goals.;Obesity: Provide education and appropriate resources to help participant work on and attain dietary goals.    Admit Weight  238 lb 9.6 oz (108.2 kg)    Goal Weight: Short Term  225 lb (102.1 kg)    Goal Weight: Long Term  185 lb (83.9 kg)    Expected Outcomes  Short Term: Continue to assess and modify interventions until short term weight is achieved;Long Term: Adherence to nutrition and physical activity/exercise program aimed toward attainment of established weight goal;Weight Maintenance: Understanding of the daily nutrition guidelines, which includes 25-35% calories from fat, 7% or less cal from saturated fats, less than 200mg  cholesterol, less than 1.5gm of sodium, & 5 or more servings of fruits and vegetables daily;Weight Loss: Understanding of general recommendations for a balanced deficit meal plan, which promotes 1-2 lb weight loss per week and includes a negative energy balance of [phone] kcal/d;Understanding recommendations for meals to include 15-35% energy as protein, 25-35% energy from fat,  35-60% energy from carbohydrates, less than 200mg  of dietary cholesterol, 20-35 gm of total fiber daily;Understanding of distribution of calorie intake throughout the day with the consumption of 4-5 meals/snacks    Diabetes  Yes last A1C at time of STEMI was 12.1-states it is due to being on prednisone for gout     Intervention  Provide education about signs/symptoms and action to take for hypo/hyperglycemia.;Provide education about proper nutrition, including hydration, and aerobic/resistive exercise prescription along with prescribed medications to achieve blood glucose in normal ranges: Fasting glucose 65-99 mg/dL    Expected Outcomes  Short Term: Participant verbalizes understanding of the signs/symptoms and immediate care of hyper/hypoglycemia, proper foot care and importance of medication, aerobic/resistive exercise and nutrition plan for blood glucose control.;Long Term: Attainment of HbA1C < 7%.    Hypertension  Yes    Intervention  Provide education on lifestyle modifcations including regular physical activity/exercise, weight management, moderate sodium restriction and increased consumption of fresh fruit, vegetables, and low fat dairy, alcohol moderation, and smoking cessation.;Monitor prescription use compliance.    Expected Outcomes  Short Term: Continued assessment and intervention until BP is < 140/58mm HG in hypertensive participants. < 130/94mm HG in hypertensive participants with diabetes, heart failure or chronic kidney disease.;Long Term: Maintenance of blood pressure at goal levels.    Lipids  Yes    Intervention  Provide education and support for participant on nutrition & aerobic/resistive exercise along with prescribed medications to achieve LDL 70mg , HDL >40mg .    Expected Outcomes  Short Term: Participant states understanding of desired cholesterol values and is compliant with medications prescribed. Participant is following exercise prescription and nutrition guidelines.;Long Term: Cholesterol controlled with medications as prescribed, with individualized exercise RX and with personalized nutrition plan. Value goals: LDL < 70mg , HDL > 40 mg.    Stress  Yes    Intervention  Offer individual and/or small group education and counseling on adjustment to heart disease, stress management and  health-related lifestyle change. Teach and support self-help strategies.;Refer participants experiencing significant psychosocial distress to appropriate mental health specialists for further evaluation and treatment. When possible, include family members and significant others in education/counseling sessions.    Expected Outcomes  Long Term: Emotional wellbeing is indicated by absence of clinically significant psychosocial distress or social isolation.;Short Term: Participant demonstrates changes in health-related behavior, relaxation and other stress management skills, ability to obtain effective social support, and compliance with psychotropic medications if prescribed.    Personal Goal Other  Yes    Personal Goal  Getting back to an exercise routine and being able to golf again.    Intervention  Patient will attend classes and implement a home exercise routine to build strength and stamina.     Expected Outcomes  Patient will continue with home exercise or join a gym by the completion of [facility] program and be able to return to golfing as an extra stress relieving activity.          Personal Goals Discharge:   Exercise Goals and Review: Exercise Goals    Row Name 07/08/17 1217             Exercise Goals   Increase Physical Activity  Yes       Intervention  Provide advice, education, support and counseling about physical activity/exercise needs.;Develop an individualized exercise prescription for aerobic and resistive training based on initial evaluation findings, risk stratification, comorbidities and participant's personal goals.       Expected Outcomes  Short Term: Attend rehab  on a regular basis to increase amount of physical activity.;Long Term: Add in home exercise to make exercise part of routine and to increase amount of physical activity.;Long Term: Exercising regularly at least 3-5 days a week.       Increase Strength and Stamina  Yes       Intervention  Provide advice,  education, support and counseling about physical activity/exercise needs.;Develop an individualized exercise prescription for aerobic and resistive training based on initial evaluation findings, risk stratification, comorbidities and participant's personal goals.       Expected Outcomes  Short Term: Increase workloads from initial exercise prescription for resistance, speed, and METs.;Short Term: Perform resistance training exercises routinely during rehab and add in resistance training at home;Long Term: Improve cardiorespiratory fitness, muscular endurance and strength as measured by increased METs and functional capacity ( )       Able to understand and use rate of perceived exertion (RPE) scale  Yes       Intervention  Provide education and explanation on how to use RPE scale       Expected Outcomes  Short Term: Able to use RPE daily in rehab to express subjective intensity level;Long Term:  Able to use RPE to guide intensity level when exercising independently       Able to understand and use Dyspnea scale  Yes       Intervention  Provide education and explanation on how to use Dyspnea scale       Expected Outcomes  Short Term: Able to use Dyspnea scale daily in rehab to express subjective sense of shortness of breath during exertion;Long Term: Able to use Dyspnea scale to guide intensity level when exercising independently       Knowledge and understanding of Target Heart Rate Range (THRR)  Yes       Intervention  Provide education and explanation of THRR including how the numbers were predicted and where they are located for reference       Expected Outcomes  Short Term: Able to state/look up THRR;Long Term: Able to use THRR to govern intensity when exercising independently;Short Term: Able to use daily as guideline for intensity in rehab       Able to check pulse independently  Yes       Intervention  Provide education and demonstration on how to check pulse in carotid and radial  arteries.;Review the importance of being able to check your own pulse for safety during independent exercise       Expected Outcomes  Short Term: Able to explain why pulse checking is important during independent exercise;Long Term: Able to check pulse independently and accurately       Understanding of Exercise Prescription  Yes       Intervention  Provide education, explanation, and written materials on patient's individual exercise prescription       Expected Outcomes  Short Term: Able to explain program exercise prescription;Long Term: Able to explain home exercise prescription to exercise independently          Nutrition & Weight - Outcomes: Pre Biometrics - 07/08/17 1216      Pre Biometrics   Height  5\' 10"  (1.778 m)    Weight  238 lb 9.6 oz (108.2 kg)    Waist Circumference  42 inches    Hip Circumference  43.5 inches    Waist to Hip Ratio  0.97 %    BMI (Calculated)  34.24    Single Leg Stand  25.3 seconds  Nutrition: Nutrition Therapy & Goals - 07/08/17 1246      Intervention Plan   Intervention  Prescribe, educate and counsel regarding individualized specific dietary modifications aiming towards targeted core components such as weight, hypertension, lipid management, diabetes, heart failure and other comorbidities.;Nutrition handout(s) given to patient.    Expected Outcomes  Short Term Goal: Understand basic principles of dietary content, such as calories, fat, sodium, cholesterol and nutrients.;Short Term Goal: A plan has been developed with personal nutrition goals set during dietitian appointment.;Long Term Goal: Adherence to prescribed nutrition plan.       Nutrition Discharge: Nutrition Assessments - 07/08/17 1246      MEDFICTS Scores   Pre Score  -- Paperwork sent home with patient       Education Questionnaire Score: Knowledge Questionnaire Score - 07/08/17 1252      Knowledge Questionnaire Score   Pre Score  25/28 Reviewed correct answers with  patient who verbalized understanding.       Goals reviewed with patient; copy given to patient.

## 2018-07-07 IMAGING — CR DG FOOT COMPLETE 3+V*L*
1 series · 3 of 3 positions shown · non-contrast
Comparison: None.

CLINICAL DATA: Left heel pain off and on for 3 months.

EXAM:
LEFT FOOT - COMPLETE 3+ VIEW

[Series 1: dg foot complete left · 0.14mm/px · 3 of 3 slices shown]
[im 1/3]
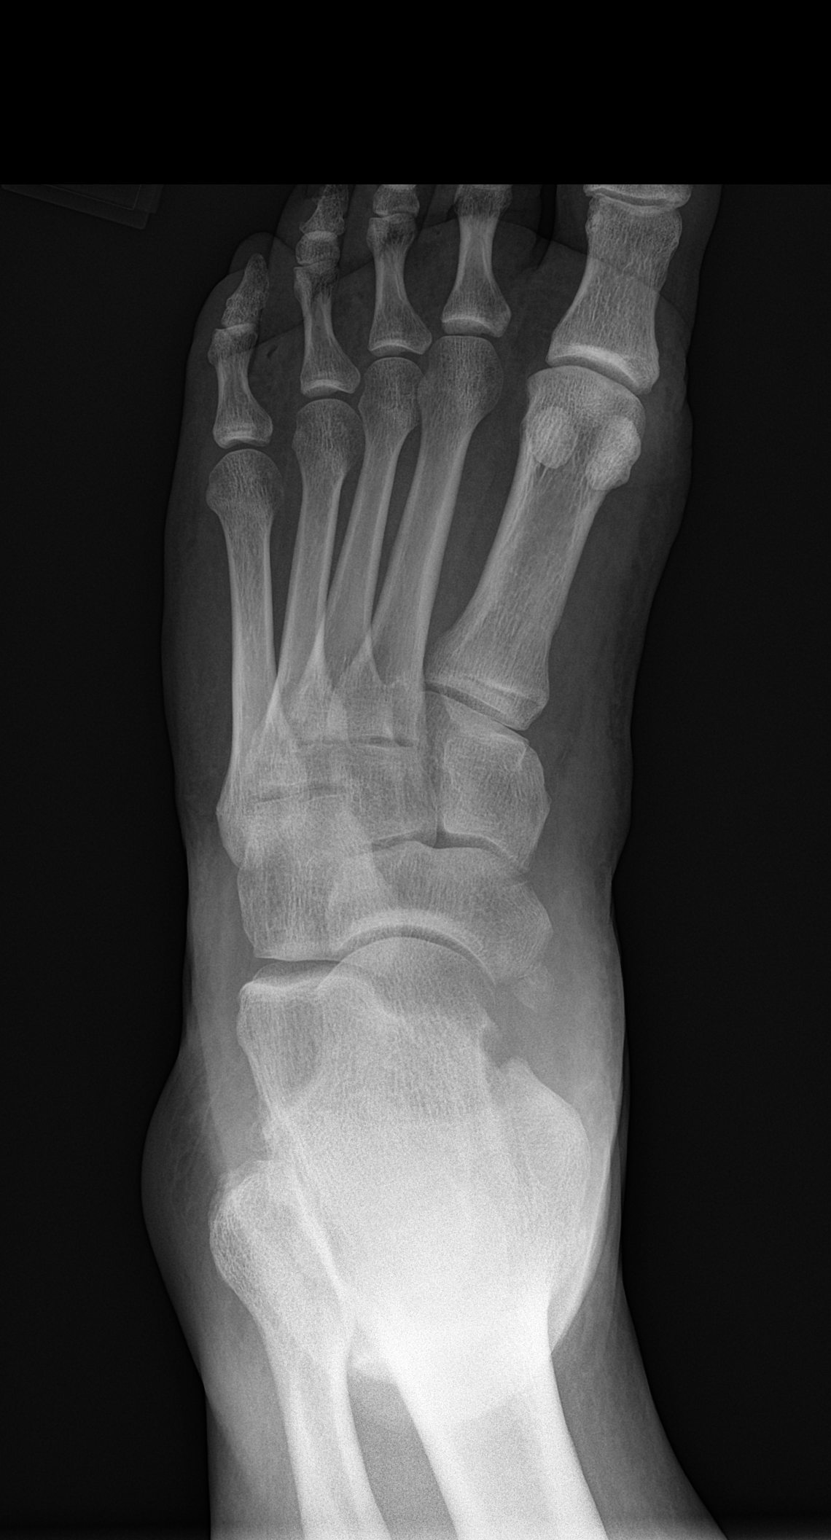
[im 2/3]
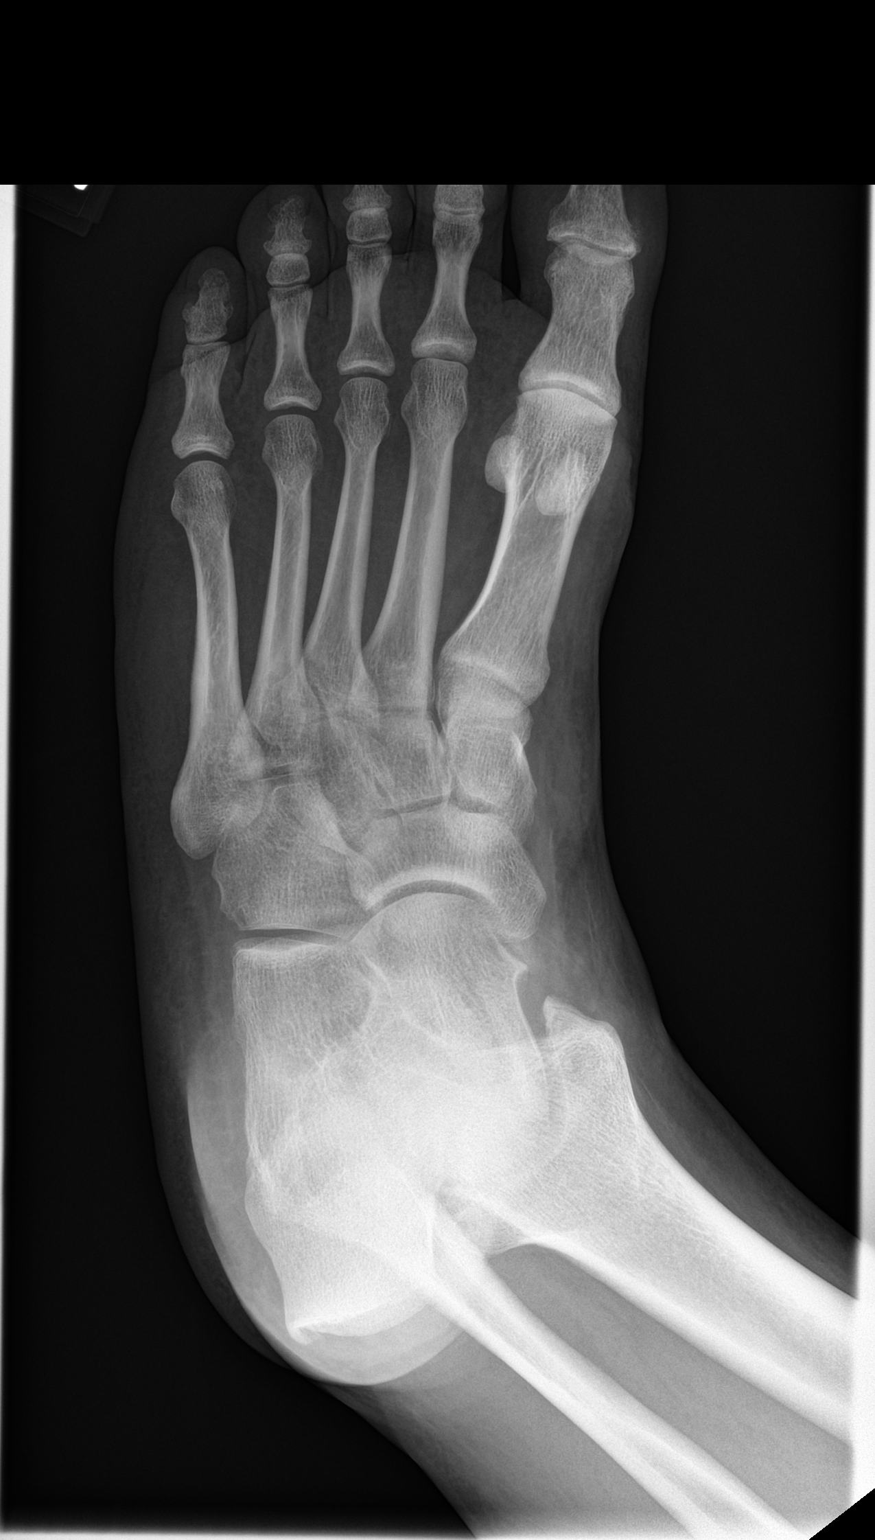
[im 3/3]
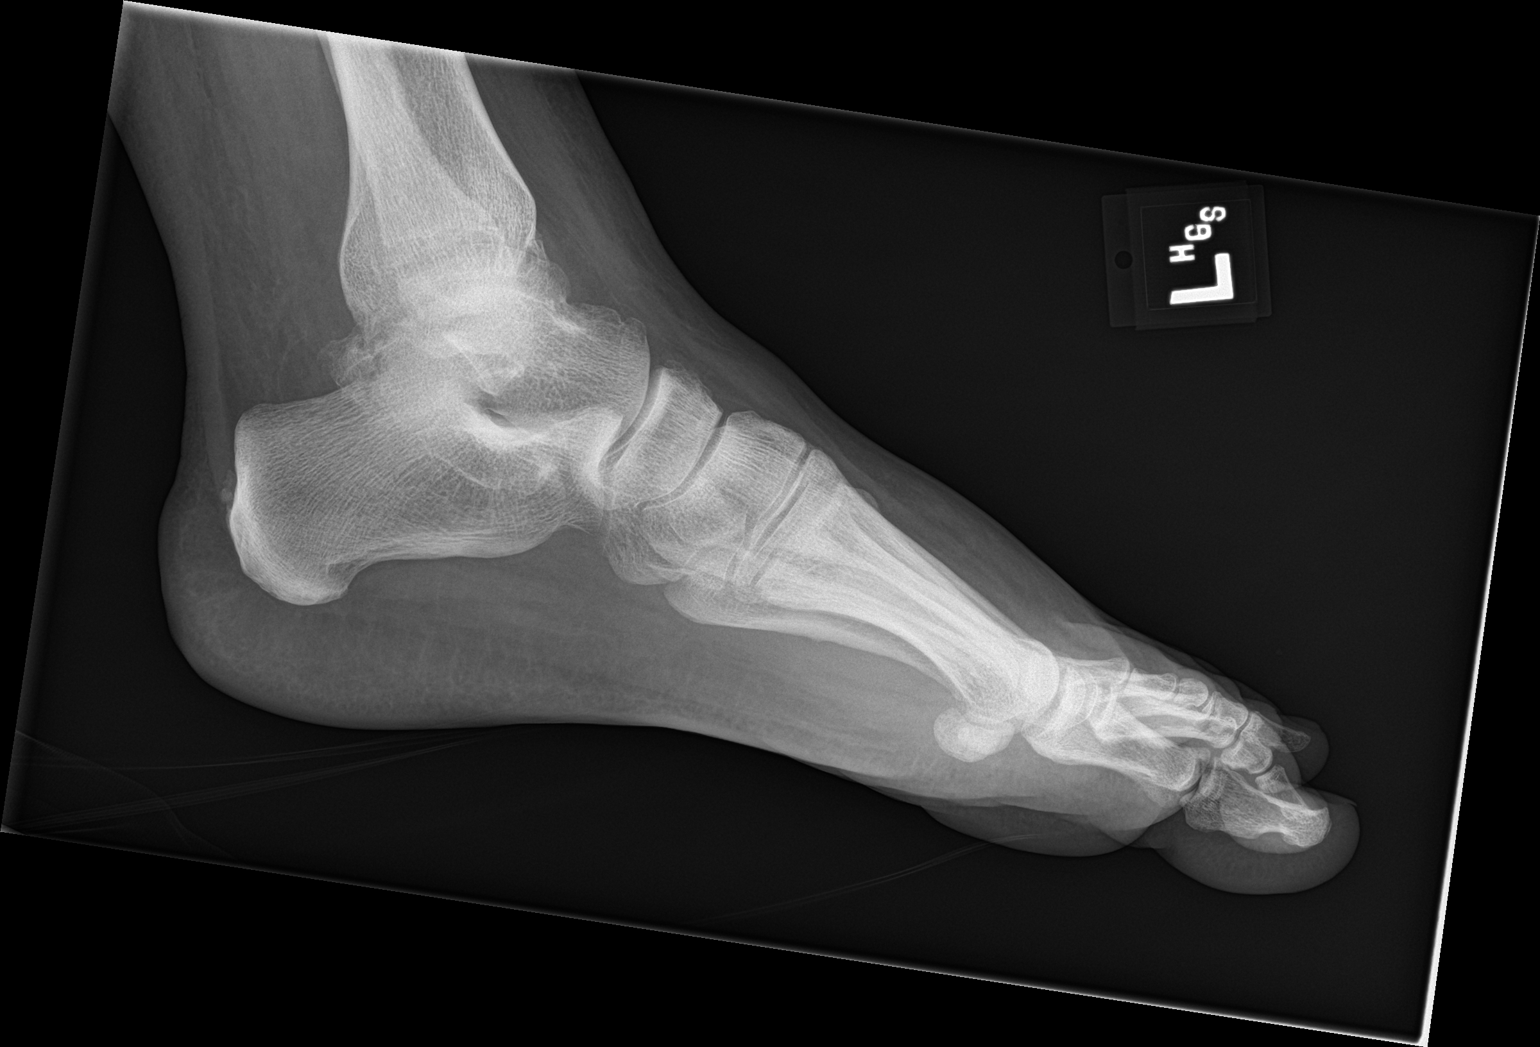

[3 of 3 positions shown; findings below may reference images not displayed]

FINDINGS: No acute bony abnormality. Specifically, no fracture, subluxation,
or dislocation. Soft tissues are intact. Joint spaces are
maintained.
IMPRESSION: Negative.

## 2020-10-04 NOTE — Progress Notes (Signed)
Presents to [facility] [employer] for on-site pre-employment drug screen.  [facility] Acct # [id number] [facility] Specimen # [id number]  Drug Screen Results = Negative  AMD

## 2020-10-08 ENCOUNTER — Other Ambulatory Visit: Payer: Self-pay

## 2020-10-08 DIAGNOSIS — Z0283 Encounter for blood-alcohol and blood-drug test: Secondary | ICD-10-CM

## 2022-02-10 DIAGNOSIS — H5213 Myopia, bilateral: Secondary | ICD-10-CM | POA: Diagnosis not present

## 2022-10-27 ENCOUNTER — Telehealth: Payer: Self-pay

## 2022-10-27 NOTE — Telephone Encounter (Signed)
LVM for patient to call back. AS, CMA 

## 2023-02-28 ENCOUNTER — Emergency Department
Admission: EM | Admit: 2023-02-28 | Discharge: 2023-03-01 | Disposition: A | Discharge status: Home / Self Care | Payer: Medicaid Other | Attending: Emergency Medicine | Admitting: Emergency Medicine

## 2023-02-28 ENCOUNTER — Emergency Department: Payer: BLUE CROSS/BLUE SHIELD

## 2023-02-28 ENCOUNTER — Other Ambulatory Visit: Payer: Self-pay

## 2023-02-28 DIAGNOSIS — F19920 Other psychoactive substance use, unspecified with intoxication, uncomplicated: Secondary | ICD-10-CM | POA: Diagnosis present

## 2023-02-28 DIAGNOSIS — F1910 Other psychoactive substance abuse, uncomplicated: Secondary | ICD-10-CM

## 2023-02-28 DIAGNOSIS — T50904A Poisoning by unspecified drugs, medicaments and biological substances, undetermined, initial encounter: Secondary | ICD-10-CM | POA: Diagnosis not present

## 2023-02-28 DIAGNOSIS — Z789 Other specified health status: Secondary | ICD-10-CM | POA: Insufficient documentation

## 2023-02-28 DIAGNOSIS — R404 Transient alteration of awareness: Secondary | ICD-10-CM | POA: Diagnosis not present

## 2023-02-28 DIAGNOSIS — F10921 Alcohol use, unspecified with intoxication delirium: Secondary | ICD-10-CM | POA: Diagnosis not present

## 2023-02-28 DIAGNOSIS — F1290 Cannabis use, unspecified, uncomplicated: Secondary | ICD-10-CM | POA: Insufficient documentation

## 2023-02-28 DIAGNOSIS — R739 Hyperglycemia, unspecified: Secondary | ICD-10-CM | POA: Diagnosis not present

## 2023-02-28 DIAGNOSIS — F29 Unspecified psychosis not due to a substance or known physiological condition: Secondary | ICD-10-CM | POA: Diagnosis not present

## 2023-02-28 DIAGNOSIS — R4182 Altered mental status, unspecified: Secondary | ICD-10-CM | POA: Diagnosis not present

## 2023-02-28 DIAGNOSIS — R0902 Hypoxemia: Secondary | ICD-10-CM | POA: Diagnosis not present

## 2023-02-28 LAB — ETHANOL: Alcohol, Ethyl (B): <10 mg/dL (ref <10)

## 2023-02-28 LAB — COMPREHENSIVE METABOLIC PANEL
ALT: 26 U/L (ref 0–44)
AST: 23 U/L (ref 15–41)
Albumin: 4 g/dL (ref 3.5–5.0)
Alkaline Phosphatase: 92 U/L (ref 38–126)
Anion gap: 13 (ref 5–15)
BUN: 22 mg/dL — ABNORMAL HIGH (ref 6–20)
CO2: 24 mmol/L (ref 22–32)
Calcium: 8.9 mg/dL (ref 8.9–10.3)
Chloride: 97 mmol/L — ABNORMAL LOW (ref 98–111)
Creatinine, Ser: 1.21 mg/dL (ref 0.61–1.24)
GFR, Estimated: >60 mL/min (ref >60)
Glucose, Bld: 346 mg/dL — ABNORMAL HIGH (ref 70–99)
Potassium: 4.7 mmol/L (ref 3.5–5.1)
Sodium: 134 mmol/L — ABNORMAL LOW (ref 135–145)
Total Bilirubin: 0.8 mg/dL (ref 0.3–1.2)
Total Protein: 7.4 g/dL (ref 6.5–8.1)

## 2023-02-28 LAB — CBC
HCT: 41.6 % (ref 39.0–52.0)
Hemoglobin: 14 g/dL (ref 13.0–17.0)
MCH: 28.8 pg (ref 26.0–34.0)
MCHC: 33.7 g/dL (ref 30.0–36.0)
MCV: 85.6 fL (ref 80.0–100.0)
Platelets: 243 10*3/uL (ref 150–400)
RBC: 4.86 MIL/uL (ref 4.22–5.81)
RDW: 13.2 % (ref 11.5–15.5)
WBC: 20.7 10*3/uL — ABNORMAL HIGH (ref 4.0–10.5)
nRBC: 0 % (ref 0.0–0.2)

## 2023-02-28 LAB — ACETAMINOPHEN LEVEL: Acetaminophen (Tylenol), Serum: <10 ug/mL — ABNORMAL LOW (ref 10–30)

## 2023-02-28 LAB — SALICYLATE LEVEL: Salicylate Lvl: <7 mg/dL — ABNORMAL LOW (ref 7.0–30.0)

## 2023-02-28 MED ORDER — SODIUM CHLORIDE 0.9 % IV BOLUS
1000.0000 mL | Freq: Once | INTRAVENOUS | Status: AC
  Administered 2023-03-01: 1000 mL via INTRAVENOUS

## 2023-02-28 NOTE — ED Provider Notes (Signed)
[facility] Provider Note   Event Date/Time   First MD Initiated Contact with Patient 02/28/23 2120     (approximate) History  Altered Mental Status  HPI Anthony Mitchell is a 47 y.o. male who presents via EMS from home where they were called due to possible overdose.  Patient had to EMS that he had drank alcohol and took 2 CBD Gummies prior to taking a nap.  Patient thence woke up and thought that he had overdosed and called EMS.  Patient was alert and oriented upon EMS arrival however when patient was en route for assessment he became extremely combative and EMS had to pull over.  Patient got out of the ambulance and laid on the floor before EMS gave 2 mg of Versed and 2 mg of Haldol intramuscularly for safety and transportation.  Patient arrives somnolent with GCS of 10 ROS: Unable to assess   Physical Exam  Triage Vital Signs: ED Triage Vitals  Encounter Vitals Group     BP 02/28/23 2132 121/77     Systolic BP Percentile --      Diastolic BP Percentile --      Pulse Rate 02/28/23 2132 (!) 106     Resp 02/28/23 2132 14     Temp --      Temp src --      SpO2 02/28/23 2132 92 %     Weight 02/28/23 2133 260 lb (117.9 kg)     Height 02/28/23 2133 5\' 8"  (1.727 m)     Head Circumference --      Peak Flow --      Pain Score 02/28/23 2133 Asleep     Pain Loc --      Pain Education --      Exclude from Growth Chart --    Most recent vital signs: Vitals:   02/28/23 2132 02/28/23 2324  BP: 121/77   Pulse: (!) 106   Resp: 14 18  SpO2: 92% 97%   General: Asleep on stretcher CV:  Good peripheral perfusion.  Resp:  Normal effort.  Abd:  No distention.  Other:  Middle-aged obese Caucasian male resting comfortably in no acute distress.  No external signs of trauma ED Results / Procedures / Treatments  Labs (all labs ordered are listed, but only abnormal results are displayed) Labs Reviewed  COMPREHENSIVE METABOLIC PANEL - Abnormal; Notable for the  following components:      Result Value   Sodium 134 (*)    Chloride 97 (*)    Glucose, Bld 346 (*)    BUN 22 (*)    All other components within normal limits  SALICYLATE LEVEL - Abnormal; Notable for the following components:   Salicylate Lvl <7.0 (*)    All other components within normal limits  ACETAMINOPHEN LEVEL - Abnormal; Notable for the following components:   Acetaminophen (Tylenol), Serum <10 (*)    All other components within normal limits  CBC - Abnormal; Notable for the following components:   WBC 20.7 (*)    All other components within normal limits  ETHANOL  URINE DRUG SCREEN, QUALITATIVE ([facility] ONLY)   PROCEDURES: Critical Care performed: No Procedures MEDICATIONS ORDERED IN ED: Medications - No data to display IMPRESSION / MDM / ASSESSMENT AND PLAN / ED COURSE  I reviewed the triage vital signs and the nursing notes.  The patient is on the cardiac monitor to evaluate for evidence of arrhythmia and/or significant heart rate changes. Patient's presentation is most consistent with acute presentation with potential threat to life or bodily function. Presents with altered mental status. +Slurred, sluggish behavior/agitation Stated EtOH intoxication. Airway maintained. Unlikely intracranial bleed, opioid intoxication or coingestion, sepsis, hypothyroidism. Suspect likely transient course of intoxication with expected  improvement of symptoms as patient metabolizes offending agent.  Plan: frequent reassessments  Disposition: Care of this patient will be signed out to the oncoming physician at the end of my shift.  All pertinent patient information conveyed and all questions answered.  All further care and disposition decisions will be made by the oncoming physician.    FINAL CLINICAL IMPRESSION(S) / ED DIAGNOSES   Final diagnoses:  Altered mental status, unspecified altered mental status type  Alcohol intoxication with delirium (HCC)   Polysubstance abuse (HCC)   Rx / DC Orders   ED Discharge Orders     None      Note:  This document was prepared using Dragon voice recognition software and may include unintentional dictation errors.   Merwyn Katos, MD 02/28/23 [phone]

## 2023-02-28 NOTE — ED Notes (Signed)
VOL  

## 2023-02-28 NOTE — ED Triage Notes (Signed)
Patient to [facility] via EMS from home.  Per EMS they were called due to possible overdose.  Earlier in the day patient drank alcohol and took 2 CBD gummies.  Patient at some point took a nap.  Upon waking patient thought maybe he had overdosed and EMS was called.  On EMS arrival patient was alert and oriented and want to come to ED for assessment.  Enroute patient became combative and EMS had to pull over, patient got out of ambulance and laid on the ground, when law enforcement arrived patient went back and forth about wanting to come to the ED and remained some what combative.  Patient was sedated for safety and transported.  EMS interventions -  Versed 2 mg and Haldol 2 mg IM give at 2041, 12-lead EKG was wnl; CBG 23, p - 103, rr - 12, CO2 24.

## 2023-03-01 ENCOUNTER — Emergency Department: Payer: BLUE CROSS/BLUE SHIELD

## 2023-03-01 ENCOUNTER — Emergency Department: Payer: Medicaid Other

## 2023-03-01 DIAGNOSIS — F19920 Other psychoactive substance use, unspecified with intoxication, uncomplicated: Secondary | ICD-10-CM | POA: Diagnosis not present

## 2023-03-01 DIAGNOSIS — Z789 Other specified health status: Secondary | ICD-10-CM

## 2023-03-01 DIAGNOSIS — F1090 Alcohol use, unspecified, uncomplicated: Secondary | ICD-10-CM

## 2023-03-01 HISTORY — DX: Alcohol use, unspecified, uncomplicated: F10.90

## 2023-03-01 HISTORY — DX: Other specified health status: Z78.9

## 2023-03-01 LAB — DIFFERENTIAL
Abs Immature Granulocytes: 0.14 10*3/uL — ABNORMAL HIGH (ref 0.00–0.07)
Basophils Absolute: 0.1 10*3/uL (ref 0.0–0.1)
Basophils Relative: 1 %
Eosinophils Absolute: 0.1 10*3/uL (ref 0.0–0.5)
Eosinophils Relative: 1 %
Immature Granulocytes: 1 %
Lymphocytes Relative: 10 %
Lymphs Abs: 2 10*3/uL (ref 0.7–4.0)
Monocytes Absolute: 1.1 10*3/uL — ABNORMAL HIGH (ref 0.1–1.0)
Monocytes Relative: 5 %
Neutro Abs: 17.4 10*3/uL — ABNORMAL HIGH (ref 1.7–7.7)
Neutrophils Relative %: 82 %

## 2023-03-01 LAB — URINE DRUG SCREEN, QUALITATIVE (ARMC ONLY)
Amphetamines, Ur Screen: NOT DETECTED
Barbiturates, Ur Screen: NOT DETECTED
Benzodiazepine, Ur Scrn: POSITIVE — AB
Cannabinoid 50 Ng, Ur ~~LOC~~: POSITIVE — AB
Cocaine Metabolite,Ur ~~LOC~~: NOT DETECTED
MDMA (Ecstasy)Ur Screen: NOT DETECTED
Methadone Scn, Ur: NOT DETECTED
Opiate, Ur Screen: NOT DETECTED
Phencyclidine (PCP) Ur S: NOT DETECTED
Tricyclic, Ur Screen: NOT DETECTED

## 2023-03-01 LAB — URINALYSIS, ROUTINE W REFLEX MICROSCOPIC
Bacteria, UA: NONE SEEN
Bilirubin Urine: NEGATIVE
Glucose, UA: >=500 mg/dL — AB
Hgb urine dipstick: NEGATIVE
Ketones, ur: NEGATIVE mg/dL
Leukocytes,Ua: NEGATIVE
Nitrite: NEGATIVE
Protein, ur: NEGATIVE mg/dL
Specific Gravity, Urine: 1.018 (ref 1.005–1.030)
Squamous Epithelial / HPF: NONE SEEN /HPF (ref 0–5)
pH: 5 (ref 5.0–8.0)

## 2023-03-01 NOTE — Consult Note (Signed)
[facility] Face-to-Face Psychiatry Consult   Reason for Consult:  Consult for Medication Management Referring Physician:  Dr. Donna Bernard MD Patient Identification: Anthony Mitchell MRN:  [id number] Principal Diagnosis: <principal problem not specified> Diagnosis:  Active Problems:   Alcohol use   Total Time spent with patient: 30 minutes  Subjective:   Anthony Mitchell is a 47 y.o. male patient admitted for "possible overdose" as per EMS. Pt reports he wanted to "mellow out" and "cook on the grill."   HPI:  Anthony Mitchell is a 47 year old male with an unknown pphx who presents alone to the ED. Patient came in on 02/28/2023 after calling 911. He reports that he was home and wanted to "mellow out" so he took "two sips of crown" and a package of CBD gummies (2 gummies total) that he had bought. Reports he started not to feel well and "like I was dying." Reports he called his wife to come home to be with the children and he called 911. Patient expresses this was "stupidity overload." He denies any intent of harming himself and that he called 911 because of his physical symptoms after taking the gummies. Reports he is never going to touch them again.   Denies SI, HI, AVH. Denies access to firearms. Patient is not currently engaged in outpatient psychiatry. 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 He lives with his wife and six children. Identifies his mood as "pretty good." Denies concerns for eating and identifies getting 8 hours of sleep per night on average.   Denies concerns or complaints at this time. Pt offered outpatient resources and pt declined.   As per TTS collateral:  "Patient states he was just trying to "mellow out and cook on the grill." Patient reports this was not an attempt to end his life. Patient states it was not a good decision and regrets the entire situation. "Stupidity overload." Patient reports he drank 2 sips of crown and had  1 CBD gummie. Patient reports he went to take a nap and woke up thinking he was going to die. Patient reports he called his wife and then called 911. Patient reports he lives with his wife and 6 kids. 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 Patient reports he feels safe to return home and does not have access to any firearms.   Psych Team spoke to patient's wife, Lorie [phone]. Lorie reports she is stunned by the series of events and states this is not patient's character. She states patient does not have psych background yet she mentioned patient does have chronic pain. She does not believe patient was trying to hurt himself nor does she have any safety concerns of patient returning home. "      Past Psychiatric History: None reported  Risk to Self: Denies Risk to Others: Denies Prior Inpatient Therapy: Denies Prior Outpatient Therapy: Denies  Past Medical History:  Past Medical History:  Diagnosis Date   Gout     Past Surgical History:  Procedure Laterality Date   NO PAST SURGERIES     Family History:  Family History  Problem Relation Age of Onset   COPD Father    Heart disease Father    Hypertension Father    Healthy Sister    Gout Brother    Heart attack Maternal Grandfather    Gout Maternal Grandfather    Alcohol abuse Maternal Grandfather  Other Mother        tachycardiac   Family Psychiatric  History: Father - PTSD Social History:  Social History   Substance and Sexual Activity  Alcohol Use No   Alcohol/week: 0.0 standard drinks of alcohol     Social History   Substance and Sexual Activity  Drug Use No    Social History   Socioeconomic History   Marital status: Married    Spouse name: Not on file   Number of children: Not on file   Years of education: Not on file   Highest education level: Not on file  Occupational History   Not on file  Tobacco Use   Smoking status: Never   Smokeless tobacco: Never  Vaping Use   Vaping status: Never Used   Substance and Sexual Activity   Alcohol use: No    Alcohol/week: 0.0 standard drinks of alcohol   Drug use: No   Sexual activity: Yes  Other Topics Concern   Not on file  Social History Narrative   Not on file   Social Determinants of Health   Financial Resource Strain: Not on file  Food Insecurity: Not on file  Transportation Needs: Not on file  Physical Activity: Not on file  Stress: Not on file  Social Connections: Not on file   Additional Social History:    Allergies:   Allergies  Allergen Reactions   Hydrocodone-Acetaminophen     itching and rash    Labs:  Results for orders placed or performed during the hospital encounter of 02/28/23 (from the past 48 hour(s))  Comprehensive metabolic panel     Status: Abnormal   Collection Time: 02/28/23  9:43 PM  Result Value Ref Range   Sodium 134 (L) 135 - 145 mmol/L   Potassium 4.7 3.5 - 5.1 mmol/L   Chloride 97 (L) 98 - 111 mmol/L   CO2 24 22 - 32 mmol/L   Glucose, Bld 346 (H) 70 - 99 mg/dL    Comment: Glucose reference range applies only to samples taken after fasting for at least 8 hours.   BUN 22 (H) 6 - 20 mg/dL   Creatinine, Ser 0.98 0.61 - 1.24 mg/dL   Calcium 8.9 8.9 - 11.9 mg/dL   Total Protein 7.4 6.5 - 8.1 g/dL   Albumin 4.0 3.5 - 5.0 g/dL   AST 23 15 - 41 U/L   ALT 26 0 - 44 U/L   Alkaline Phosphatase 92 38 - 126 U/L   Total Bilirubin 0.8 0.3 - 1.2 mg/dL   GFR, Estimated >14 >78 mL/min    Comment: (NOTE) Calculated using the CKD-EPI Creatinine Equation (2021)    Anion gap 13 5 - 15    Comment: Performed at [facility], [address] [place], [place] [address]  Ethanol     Status: None   Collection Time: 02/28/23  9:43 PM  Result Value Ref Range   Alcohol, Ethyl (B) <10 <10 mg/dL    Comment: (NOTE) Lowest detectable limit for serum alcohol is 10 mg/dL.  For medical purposes only. Performed at [facility], [address] [place], [place] [address]   Salicylate level      Status: Abnormal   Collection Time: 02/28/23  9:43 PM  Result Value Ref Range   Salicylate Lvl <7.0 (L) 7.0 - 30.0 mg/dL    Comment: Performed at [facility], [address] [place], [place] [address]  Acetaminophen level     Status: Abnormal  Collection Time: 02/28/23  9:43 PM  Result Value Ref Range   Acetaminophen (Tylenol), Serum <10 (L) 10 - 30 ug/mL    Comment: (NOTE) Therapeutic concentrations vary significantly. A range of 10-30 ug/mL  Isadora Delorey be an effective concentration for many patients. However, some  are best treated at concentrations outside of this range. Acetaminophen concentrations >150 ug/mL at 4 hours after ingestion  and >50 ug/mL at 12 hours after ingestion are often associated with  toxic reactions.  Performed at [facility], [address] [place], [place] [address]   cbc     Status: Abnormal   Collection Time: 02/28/23  9:43 PM  Result Value Ref Range   WBC 20.7 (H) 4.0 - 10.5 K/uL   RBC 4.86 4.22 - 5.81 MIL/uL   Hemoglobin 14.0 13.0 - 17.0 g/dL   HCT 60.4 54.0 - 98.1 %   MCV 85.6 80.0 - 100.0 fL   MCH 28.8 26.0 - 34.0 pg   MCHC 33.7 30.0 - 36.0 g/dL   RDW 19.1 47.8 - 29.5 %   Platelets 243 150 - 400 K/uL   nRBC 0.0 0.0 - 0.2 %    Comment: Performed at [facility], [address] [place], [place] [address]  Differential     Status: Abnormal   Collection Time: 02/28/23  9:43 PM  Result Value Ref Range   Neutrophils Relative % 82 %   Neutro Abs 17.4 (H) 1.7 - 7.7 K/uL   Lymphocytes Relative 10 %   Lymphs Abs 2.0 0.7 - 4.0 K/uL   Monocytes Relative 5 %   Monocytes Absolute 1.1 (H) 0.1 - 1.0 K/uL   Eosinophils Relative 1 %   Eosinophils Absolute 0.1 0.0 - 0.5 K/uL   Basophils Relative 1 %   Basophils Absolute 0.1 0.0 - 0.1 K/uL   Immature Granulocytes 1 %   Abs Immature Granulocytes 0.14 (H) 0.00 - 0.07 K/uL    Comment: Performed at [facility], [address] [place], [place] [address]  Urine Drug  Screen, Qualitative     Status: Abnormal   Collection Time: 03/01/23  2:00 AM  Result Value Ref Range   Tricyclic, Ur Screen NONE DETECTED NONE DETECTED   Amphetamines, Ur Screen NONE DETECTED NONE DETECTED   MDMA (Ecstasy)Ur Screen NONE DETECTED NONE DETECTED   Cocaine Metabolite,Ur Roy NONE DETECTED NONE DETECTED   Opiate, Ur Screen NONE DETECTED NONE DETECTED   Phencyclidine (PCP) Ur S NONE DETECTED NONE DETECTED   Cannabinoid 50 Ng, Ur Bricelyn POSITIVE (A) NONE DETECTED   Barbiturates, Ur Screen NONE DETECTED NONE DETECTED   Benzodiazepine, Ur Scrn POSITIVE (A) NONE DETECTED   Methadone Scn, Ur NONE DETECTED NONE DETECTED    Comment: (NOTE) Tricyclics + metabolites, urine    Cutoff 1000 ng/mL Amphetamines + metabolites, urine  Cutoff 1000 ng/mL MDMA (Ecstasy), urine              Cutoff 500 ng/mL Cocaine Metabolite, urine          Cutoff 300 ng/mL Opiate + metabolites, urine        Cutoff 300 ng/mL Phencyclidine (PCP), urine         Cutoff 25 ng/mL Cannabinoid, urine                 Cutoff 50 ng/mL Barbiturates + metabolites, urine  Cutoff 200 ng/mL Benzodiazepine, urine              Cutoff 200 ng/mL Methadone, urine  Cutoff 300 ng/mL  The urine drug screen provides only a preliminary, unconfirmed analytical test result and should not be used for non-medical purposes. Clinical consideration and professional judgment should be applied to any positive drug screen result due to possible interfering substances. A more specific alternate chemical method must be used in order to obtain a confirmed analytical result. Gas chromatography / mass spectrometry (GC/MS) is the preferred confirm atory method. Performed at [facility], [address] [place], [place] [address]   Urinalysis, Routine w reflex microscopic -Urine, Clean Catch     Status: Abnormal   Collection Time: 03/01/23  2:00 AM  Result Value Ref Range   Color, Urine YELLOW (A) YELLOW   APPearance  CLEAR (A) CLEAR   Specific Gravity, Urine 1.018 1.005 - 1.030   pH 5.0 5.0 - 8.0   Glucose, UA >=500 (A) NEGATIVE mg/dL   Hgb urine dipstick NEGATIVE NEGATIVE   Bilirubin Urine NEGATIVE NEGATIVE   Ketones, ur NEGATIVE NEGATIVE mg/dL   Protein, ur NEGATIVE NEGATIVE mg/dL   Nitrite NEGATIVE NEGATIVE   Leukocytes,Ua NEGATIVE NEGATIVE   WBC, UA 0-5 0 - 5 WBC/hpf   Bacteria, UA NONE SEEN NONE SEEN   Squamous Epithelial / HPF NONE SEEN 0 - 5 /HPF   Hyaline Casts, UA PRESENT     Comment: Performed at [facility], [address] [place], [place] [address]    No current facility-administered medications for this encounter.   Current Outpatient Medications  Medication Sig Dispense Refill   allopurinol (ZYLOPRIM) 300 MG tablet Take 2 tablets (600 mg total) by mouth daily. 60 tablet 1   atorvastatin (LIPITOR) 80 MG tablet Take 80 mg by mouth daily.  2   gabapentin (NEURONTIN) 100 MG capsule TAKE 1 CAPSULE BY MOUTH THREE TIMES A DAY  11   ibuprofen (ADVIL,MOTRIN) 600 MG tablet Take 1 tablet by mouth as needed.     lisinopril (PRINIVIL,ZESTRIL) 5 MG tablet Take 5 mg by mouth daily.  2   metFORMIN (GLUCOPHAGE) 500 MG tablet TAKE 1 TABLET (500 MG TOTAL) BY MOUTH 2 (TWO) TIMES DAILY WITH A MEAL. 60 tablet 0   Multiple Vitamin (MULTI-VITAMINS) TABS Take by mouth.     MULTIPLE VITAMIN PO Take 1 tablet by mouth daily.     Omega-3 Fatty Acids (FISH OIL) 500 MG CAPS Take by mouth.     oxyCODONE-acetaminophen (ROXICET) 5-325 MG tablet Take 1 tablet by mouth every 6 (six) hours as needed for severe pain. (Patient not taking: Reported on 07/18/2016) 10 tablet 0   pravastatin (PRAVACHOL) 20 MG tablet Take 1 tablet (20 mg total) by mouth daily. (Patient not taking: Reported on 07/18/2016) 30 tablet 3   predniSONE (DELTASONE) 10 MG tablet Take 1 tablet (10 mg total) by mouth daily. 42 tablet 0   predniSONE (DELTASONE) 10 MG tablet 10 MG TABLETS TAPERED AS INDICATED (6, 6, 5,5, 4,4,3,3,2,2,1,1) FOR  GOUTY FLARE     Semaglutide (OZEMPIC) 0.25 or 0.5 MG/DOSE SOPN Inject into the skin.     ticagrelor (BRILINTA) 90 MG TABS tablet Take by mouth.      Musculoskeletal: Strength & Muscle Tone: within normal limits Gait & Station: normal Patient leans: N/A            Psychiatric Specialty Exam:  Presentation  General Appearance: Appropriate for Environment  Eye Contact:Good  Speech:Clear and Coherent; Normal Rate  Speech Volume:Normal  Handedness:No data recorded  Mood and Affect  Mood:Euthymic ("pretty good.")  Affect:Appropriate; Congruent  Thought Process  Thought Processes:Coherent  Descriptions of Associations:Intact  Orientation:Full (Time, Place and Person)  Thought Content:WDL  History of Schizophrenia/Schizoaffective disorder:No  Duration of Psychotic Symptoms:No data recorded Hallucinations:Hallucinations: None  Ideas of Reference:None  Suicidal Thoughts:Suicidal Thoughts: No  Homicidal Thoughts:Homicidal Thoughts: No   Sensorium  Memory:Immediate Good; Recent Good; Remote Good  Judgment:Good  Insight:Good   Executive Functions  Concentration:Good  Attention Span:Good  Recall:Good  Fund of Knowledge:Good  Language:Good   Psychomotor Activity  Psychomotor Activity:Psychomotor Activity: Normal   Assets  Assets:Communication Skills; Desire for Improvement; Financial Resources/Insurance; Housing; Intimacy; Resilience; Social Support   Sleep  Sleep:Sleep: Good   Physical Exam: Physical Exam Vitals and nursing note reviewed.  Constitutional:      Appearance: Normal appearance.  HENT:     Head: Normocephalic.  Pulmonary:     Effort: Pulmonary effort is normal.  Musculoskeletal:        General: Normal range of motion.     Cervical back: Normal range of motion.  Neurological:     Mental Status: He is alert and oriented to person, place, and time.  Psychiatric:        Mood and Affect: Mood normal.         Behavior: Behavior normal.        Thought Content: Thought content normal.        Judgment: Judgment normal.    Review of Systems  Psychiatric/Behavioral:  Negative for depression, hallucinations and suicidal ideas.   All other systems reviewed and are negative.  Blood pressure (!) 142/84, pulse 97, temperature 97.7 F (36.5 C), resp. rate 18, height 5\' 8"  (1.727 m), weight 117.9 kg, SpO2 95%. Body mass index is 39.53 kg/m.  Treatment Plan Summary: Patient does not meet criteria for psychiatric admission.  Offered outpatient resources - pt declined  Disposition: No evidence of imminent risk to self or others at present.   Patient does not meet criteria for psychiatric inpatient admission.  Viktoria Gruetzmacher, NP 03/01/2023 12:41 PM

## 2023-03-01 NOTE — ED Notes (Signed)
VOL/pending psych consult 

## 2023-03-01 NOTE — BH Assessment (Signed)
Psych Team met with patient for reassessment. Patient states he was just trying to "mellow out and cook on the grill." Patient reports this was not an attempt to end his life. Patient states it was not a good decision and regrets the entire situation. "Stupidity overload." Patient reports he drank 2 sips of crown and had 1 CBD gummie. Patient reports he went to take a nap and woke up thinking he was going to die. Patient reports he called his wife and then called 911. Patient reports he lives with his wife and 6 kids. 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 Patient reports he feels safe to return home and does not have access to any firearms.  Psych Team spoke to patient's wife, Anthony Mitchell [phone]. Anthony Mitchell reports she is stunned by the series of events and states this is not patient's character. She states patient does not have psych background yet she mentioned patient does have chronic pain. She does not believe patient was trying to hurt himself nor does she have any safety concerns of patient returning home.

## 2023-03-01 NOTE — ED Notes (Signed)
Hospital meal provided, pt tolerated w/o complaints.  Waste discarded appropriately.  

## 2023-03-01 NOTE — Discharge Instructions (Addendum)
You have been seen in the emergency department for a  psychiatric concern. You have been evaluated both medically as well as psychiatrically. Please follow-up with your outpatient resources provided. Return to the emergency department for any worsening symptoms, or any thoughts of hurting yourself or anyone else so that we may attempt to help you. 

## 2023-03-01 NOTE — BH Assessment (Addendum)
Comprehensive Clinical Assessment (CCA) Note  03/01/2023 Anthony Mitchell [id number]  Chief Complaint: Patient is a 47 year old male presenting to [facility] ED voluntarily. Per triage note Per EMS they were called due to possible overdose. Earlier in the day patient drank alcohol and took 2 CBD gummies. Patient at some point took a nap. Upon waking patient thought maybe he had overdosed and EMS was called. On EMS arrival patient was alert and oriented and wanted to come to ED for assessment. Enroute patient became combative and EMS had to pull over, patient got out of ambulance and laid on the ground, when law enforcement arrived patient went back and forth about wanting to come to the ED and remained some what combative. Patient was sedated for safety and transported.   During assessment patient appears alert and oriented x3, patient's speech is slurred and soft and he has some difficulty staying awake. When asked what happened tonight he reports "I took a CBD gummy and a couple shots of alcohol." Patient reports that he does not typically use gummies and "hardly drinks alcohol." Patient also doesn't recall becoming combative. Patient denies any issues at home, he reports living with his wife and 6 kids. Patient also denies SI/HI/AH/VH.  Per Psyc NP Cecilio Asper patient to be reassessed Chief Complaint  Patient presents with   Altered Mental Status   Visit Diagnosis: Substance intoxication    CCA Screening, Triage and Referral (STR)  Patient Reported Information How did you hear about [place]? Legal System  Referral name: No data recorded Referral phone number: No data recorded  Whom do you see for routine medical problems? No data recorded Practice/Facility Name: No data recorded Practice/Facility Phone Number: No data recorded Name of Contact: No data recorded Contact Number: No data recorded Contact Fax Number: No data recorded Prescriber Name: No data recorded Prescriber Address (if known):  No data recorded  What Is the Reason for Your Visit/Call Today? Patient to [facility] via EMS from home.  Per EMS they were called due to possible overdose.  Earlier in the day patient drank alcohol and took 2 CBD gummies.  Patient at some point took a nap.  Upon waking patient thought maybe he had overdosed and EMS was called.  On EMS arrival patient was alert and oriented and want to come to ED for assessment.  Enroute patient became combative and EMS had to pull over, patient got out of ambulance and laid on the ground, when law enforcement arrived patient went back and forth about wanting to come to the ED and remained some what combative.  Patient was sedated for safety and transported.  How Long Has This Been Causing You Problems? > than 6 months  What Do You Feel Would Help You the Most Today? No data recorded  Have You Recently Been in Any Inpatient Treatment (Hospital/Detox/Crisis Center/28-Day Program)? No data recorded Name/Location of Program/Hospital:No data recorded How Long Were You There? No data recorded When Were You Discharged? No data recorded  Have You Ever Received Services From [facility] Before? No data recorded Who Do You See at [facility]? No data recorded  Have You Recently Had Any Thoughts About Hurting Yourself? No  Are You Planning to Commit Suicide/Harm Yourself At This time? No   Have you Recently Had Thoughts About Hurting Someone Karolee Ohs? No  Explanation: No data recorded  Have You Used Any Alcohol or Drugs in the Past 24 Hours? Yes  How Long Ago Did You Use Drugs or  Alcohol? No data recorded What Did You Use and How Much? Alcohol, CBD gummy   Do You Currently Have a Therapist/Psychiatrist? No  Name of Therapist/Psychiatrist: No data recorded  Have You Been Recently Discharged From Any Office Practice or Programs? No  Explanation of Discharge From Practice/Program: No data recorded    CCA Screening Triage Referral Assessment Type of Contact:  Face-to-Face  Is this Initial or Reassessment? No data recorded Date Telepsych consult ordered in [facility]:  No data recorded Time Telepsych consult ordered in [facility]:  No data recorded  Patient Reported Information Reviewed? No data recorded Patient Left Without Being Seen? No data recorded Reason for Not Completing Assessment: No data recorded  Collateral Involvement: No data recorded  Does Patient Have a Court Appointed Legal Guardian? No data recorded Name and Contact of Legal Guardian: No data recorded If Minor and Not Living with Parent(s), Who has Custody? No data recorded Is CPS involved or ever been involved? Never  Is APS involved or ever been involved? Never   Patient Determined To Be At Risk for Harm To Self or Others Based on Review of Patient Reported Information or Presenting Complaint? No  Method: No data recorded Availability of Means: No data recorded Intent: No data recorded Notification Required: No data recorded Additional Information for Danger to Others Potential: No data recorded Additional Comments for Danger to Others Potential: No data recorded Are There Guns or Other Weapons in Your Home? No  Types of Guns/Weapons: No data recorded Are These Weapons Safely Secured?                            No data recorded Who Could Verify You Are Able To Have These Secured: No data recorded Do You Have any Outstanding Charges, Pending Court Dates, Parole/Probation? No data recorded Contacted To Inform of Risk of Harm To Self or Others: No data recorded  Location of Assessment: [facility] ED   Does Patient Present under Involuntary Commitment? No  IVC Papers Initial File Date: No data recorded  [place] of Residence: [place]   Patient Currently Receiving the Following Services: No data recorded  Determination of Need: Emergent (2 hours)   Options For Referral: No data recorded    CCA Biopsychosocial Intake/Chief Complaint:  No data recorded Current  Symptoms/Problems: No data recorded  Patient Reported Schizophrenia/Schizoaffective Diagnosis in Past: No   Strengths: Patient is able to communicate his needs  Preferences: No data recorded Abilities: No data recorded  Type of Services Patient Feels are Needed: No data recorded  Initial Clinical Notes/Concerns: No data recorded  Mental Health Symptoms Depression:   Change in energy/activity; Fatigue   Duration of Depressive symptoms:  Greater than two weeks   Mania:   None   Anxiety:    None   Psychosis:   None   Duration of Psychotic symptoms: No data recorded  Trauma:   None   Obsessions:   None   Compulsions:   None   Inattention:   None   Hyperactivity/Impulsivity:   None   Oppositional/Defiant Behaviors:   None   Emotional Irregularity:   None   Other Mood/Personality Symptoms:  No data recorded   Mental Status Exam Appearance and self-care  Stature:   Average   Weight:   Overweight   Clothing:   Disheveled   Grooming:   Neglected   Cosmetic use:   None   Posture/gait:   Slumped   Motor activity:  Restless; Slowed   Sensorium  Attention:   Normal   Concentration:   Normal   Orientation:   Person; Place   Recall/memory:   Defective in Short-term   Affect and Mood  Affect:   Appropriate   Mood:   Other (Comment)   Relating  Eye contact:   Avoided   Facial expression:   Responsive   Attitude toward examiner:   Cooperative   Thought and Language  Speech flow:  Slurred; Soft   Thought content:   Appropriate to Mood and Circumstances   Preoccupation:   None   Hallucinations:   None   Organization:  No data recorded  [employer] of Knowledge:   Fair   Intelligence:   Average   Abstraction:   Functional   Judgement:   Impaired   Reality Testing:   Adequate   Insight:   Fair   Decision Making:   Normal   Social Functioning  Social Maturity:   Responsible    Social Judgement:   Heedless   Stress  Stressors:   Other (Comment)   Coping Ability:   Normal   Skill Deficits:   None   Supports:   Family     Religion: Religion/Spirituality Are You A Religious Person?: No  Leisure/Recreation: Leisure / Recreation Do You Have Hobbies?: No  Exercise/Diet: Exercise/Diet Do You Exercise?: No Have You Gained or Lost A Significant Amount of Weight in the Past Six Months?: No Do You Follow a Special Diet?: No Do You Have Any Trouble Sleeping?: No   CCA Employment/Education Employment/Work Situation: Employment / Work Situation Employment Situation: Unemployed Has Patient ever Been in Equities trader?: No  Education: Education Is Patient Currently Attending School?: No Did You Have An Individualized Education Program (IIEP): No Did You Have Any Difficulty At [employer]?: No Patient's Education Has Been Impacted by Current Illness: No   CCA Family/Childhood History Family and Relationship History: Family history Marital status: Married Number of Years Married:  (unknown) What types of issues is patient dealing with in the relationship?: no reported issues Additional relationship information: none reported Does patient have children?: Yes How many children?: 6 How is patient's relationship with their children?: unknown  Childhood History:  Childhood History Did patient suffer any verbal/emotional/physical/sexual abuse as a child?: No Did patient suffer from severe childhood neglect?: No Has patient ever been sexually abused/assaulted/raped as an adolescent or adult?: No Was the patient ever a victim of a crime or a disaster?: No Witnessed domestic violence?: No Has patient been affected by domestic violence as an adult?: No  Child/Adolescent Assessment:     CCA Substance Use Alcohol/Drug Use: Alcohol / Drug Use Pain Medications: see mar Prescriptions: see mar Over the Counter: see mar History of alcohol / drug use?:  Yes Substance #1 Name of Substance 1: Alcohol 1 - Amount (size/oz): unknown amounts, patient reports that he does not drink often 1 - Frequency: patient reports that he does not drink often 1 - Last Use / Amount: 02/28/23 Substance #2 Name of Substance 2: CBD Gummy                     ASAM's:  Six Dimensions of Multidimensional Assessment  Dimension 1:  Acute Intoxication and/or Withdrawal Potential:      Dimension 2:  Biomedical Conditions and Complications:      Dimension 3:  Emotional, Behavioral, or Cognitive Conditions and Complications:     Dimension 4:  Readiness to Change:  Dimension 5:  Relapse, Continued use, or Continued Problem Potential:     Dimension 6:  Recovery/Living Environment:     ASAM Severity Score:    ASAM Recommended Level of Treatment:     Substance use Disorder (SUD)    Recommendations for Services/Supports/Treatments:    DSM5 Diagnoses: Patient Active Problem List   Diagnosis Date Noted   CAD (coronary artery disease), native coronary artery 05/21/2017   Encounter for long-term (current) use of high-risk medication 09/30/2016   Type 2 diabetes mellitus (HCC) 12/14/2015   Hyperlipidemia 12/14/2015   OSA (obstructive sleep apnea) 12/14/2015   Acid reflux 03/02/2015   Arthritis urica 03/02/2015   Extreme obesity 03/02/2015   Acute idiopathic gout of multiple sites 03/27/2014   Chronic gouty arthropathy 12/04/2008    Patient Centered Plan: Patient is on the following Treatment Plan(s):  Substance Abuse   Referrals to Alternative Service(s): Referred to Alternative Service(s):   Place:   Date:   Time:    Referred to Alternative Service(s):   Place:   Date:   Time:    Referred to Alternative Service(s):   Place:   Date:   Time:    Referred to Alternative Service(s):   Place:   Date:   Time:      @BHCOLLABOFCARE @  [employer], LCAS-A

## 2023-03-01 NOTE — ED Provider Notes (Signed)
-----------------------------------------   23:50 PM on 02/28/2023 -----------------------------------------   Care assumed of this patient who was brought in for overdose, altered mental status.  Given patient's somnolence, will obtain CT head.  Leukocytosis noted, will obtain chest x-ray.  Infuse IV fluids and reassess.   ----------------------------------------- 2:30 AM on 03/01/2023 -----------------------------------------   Patient did wake up on IV insertion and was able to go to CT and chest x-ray which were both negative.  UA is also negative for infection; suspect leukocytosis secondary to stress reaction.  At this time patient is pending psychiatric evaluation and disposition.   Irean Hong, MD 03/01/23 [phone]

## 2023-03-01 NOTE — ED Notes (Signed)
Pt had wife as a visitor today for 15 Mins.

## 2023-03-01 NOTE — ED Provider Notes (Signed)
-----------------------------------------   12:41 PM on 03/01/2023 ----------------------------------------- Patient has been seen and evaluated by psychiatry.  They believe the patient is safe for discharge home from a psychiatric standpoint.  Patient's medical workup has been largely nonrevealing, reassuring CT scan of the head clear chest x-ray patient did have a mild leukocytosis otherwise reassuring labs besides mild hyperglycemia.  Given the patient's reassuring workup we will discharge the patient home with PCP follow-up.   Minna Antis, MD 03/01/23 1242

## 2023-03-02 NOTE — Group Note (Deleted)

## 2023-05-04 ENCOUNTER — Other Ambulatory Visit: Payer: Self-pay

## 2023-05-04 ENCOUNTER — Ambulatory Visit: Payer: BLUE CROSS/BLUE SHIELD

## 2023-05-04 DIAGNOSIS — M109 Gout, unspecified: Secondary | ICD-10-CM | POA: Insufficient documentation

## 2023-06-10 ENCOUNTER — Ambulatory Visit: Payer: BLUE CROSS/BLUE SHIELD | Admitting: Cardiology

## 2024-05-04 ENCOUNTER — Emergency Department

## 2024-05-04 ENCOUNTER — Emergency Department
Admission: EM | Admit: 2024-05-04 | Discharge: 2024-05-04 | Disposition: A | Discharge status: Home / Self Care | Attending: Emergency Medicine | Admitting: Emergency Medicine

## 2024-05-04 ENCOUNTER — Other Ambulatory Visit: Payer: Self-pay

## 2024-05-04 DIAGNOSIS — R519 Headache, unspecified: Secondary | ICD-10-CM | POA: Diagnosis present

## 2024-05-04 DIAGNOSIS — E119 Type 2 diabetes mellitus without complications: Secondary | ICD-10-CM | POA: Insufficient documentation

## 2024-05-04 DIAGNOSIS — I10 Essential (primary) hypertension: Secondary | ICD-10-CM | POA: Insufficient documentation

## 2024-05-04 LAB — BASIC METABOLIC PANEL WITH GFR
Anion gap: 13 (ref 5–15)
BUN: 15 mg/dL (ref 6–20)
CO2: 24 mmol/L (ref 22–32)
Calcium: 9.6 mg/dL (ref 8.9–10.3)
Chloride: 100 mmol/L (ref 98–111)
Creatinine, Ser: 0.99 mg/dL (ref 0.61–1.24)
GFR, Estimated: >60 mL/min (ref >60)
Glucose, Bld: 268 mg/dL — ABNORMAL HIGH (ref 70–99)
Potassium: 4 mmol/L (ref 3.5–5.1)
Sodium: 137 mmol/L (ref 135–145)

## 2024-05-04 LAB — CBC
HCT: 45.4 % (ref 39.0–52.0)
Hemoglobin: 15.7 g/dL (ref 13.0–17.0)
MCH: 28.3 pg (ref 26.0–34.0)
MCHC: 34.6 g/dL (ref 30.0–36.0)
MCV: 81.9 fL (ref 80.0–100.0)
Platelets: 241 K/uL (ref 150–400)
RBC: 5.54 MIL/uL (ref 4.22–5.81)
RDW: 12.3 % (ref 11.5–15.5)
WBC: 9.3 K/uL (ref 4.0–10.5)
nRBC: 0 % (ref 0.0–0.2)

## 2024-05-04 LAB — TROPONIN T, HIGH SENSITIVITY
Troponin T High Sensitivity: <15 ng/L (ref 0–19)
Troponin T High Sensitivity: <15 ng/L (ref 0–19)

## 2024-05-04 MED ORDER — METOPROLOL SUCCINATE ER 50 MG PO TB24: 50.0000 mg | ORAL_TABLET | Freq: Every day | ORAL | 3 refills | Status: AC

## 2024-05-04 MED ORDER — KETOROLAC TROMETHAMINE 30 MG/ML IJ SOLN
30.0000 mg | Freq: Once | INTRAMUSCULAR | Status: AC
  Administered 2024-05-04: 30 mg via INTRAMUSCULAR
  Filled 2024-05-04: qty 1

## 2024-05-04 MED ORDER — CLONIDINE HCL 0.1 MG PO TABS
0.1000 mg | ORAL_TABLET | Freq: Once | ORAL | Status: AC
  Administered 2024-05-04: 0.1 mg via ORAL
  Filled 2024-05-04: qty 1

## 2024-05-04 NOTE — ED Provider Notes (Signed)
 [facility] Provider Note    Event Date/Time   First MD Initiated Contact with Patient 05/04/24 1606     (approximate)   History   Chest Pain   HPI  Anthony Mitchell is a 48 y.o. male  with history of  MI in 2018, hypertension, type 2 DM, gout, and as listed in EMR presents to the emergency department for treatment and evaluation of headache, hypertension for the past 2 days. He is not currently on any blood pressure medications. Headache is uncommon. No vision changes, slurred speech, extremity weakness.   Physical Exam    Vitals:   05/04/24 2018 05/04/24 2020  BP:  (!) 153/102  Pulse: 79   Resp: 19   Temp:    SpO2: 96%     General: Awake, no distress.  CV:  Good peripheral perfusion.  Resp:  Normal effort. Abd:  No distention.  Other:     ED Results / Procedures / Treatments   Labs (all labs ordered are listed, but only abnormal results are displayed)  Labs Reviewed  BASIC METABOLIC PANEL WITH GFR - Abnormal; Notable for the following components:      Result Value   Glucose, Bld 268 (*)    All other components within normal limits  CBC  TROPONIN T, HIGH SENSITIVITY  TROPONIN T, HIGH SENSITIVITY     EKG  NSR, rate of 96   RADIOLOGY  Image and radiology report reviewed and interpreted by me. Radiology report consistent with the same.  Chest x-ray shows opacification of the right middle lobe which may be due to atelectasis, infection and less likely loculated pleural fluid.  Chest CT shows no acute intrathoracic findings.  Coronary artery atherosclerosis is noted.  CT head negative for acute concerns.  PROCEDURES:  Critical Care performed: No  Procedures   MEDICATIONS ORDERED IN ED:  Medications  ketorolac (TORADOL) 30 MG/ML injection 30 mg (30 mg Intramuscular Given 05/04/24 1843)  cloNIDine (CATAPRES) tablet 0.1 mg (0.1 mg Oral Given 05/04/24 1842)     IMPRESSION / MDM / ASSESSMENT AND PLAN / ED COURSE    I have reviewed the triage note and vital signs. Vital signs  show hypertension   Differential diagnosis includes, but is not limited to hypertension,  hypertensive urgency, anxiety, cardiac event.  Patient's presentation is most consistent with acute presentation with potential threat to life or bodily function.  The patient is on the cardiac monitor to evaluate for evidence of arrhythmia and/or significant heart rate changes.  48 year old male presenting to the emergency department for treatment and evaluation of hypertension.  See HPI for further details.  Triage note indicates that he had also complained of epigastric pain however currently denies at this time.  He is more concerned about the headache and elevated blood pressure.  He arrived to the emergency department via EMS after stopping at the fire department to have them check his blood pressure.  Currently denies chest pain or shortness of breath.   Chest x-ray shows an opacity over the right middle lobe that is not well defined.  CT of the chest is normal.  I did consider admission due to history of MI, however EKG and serial troponins are both reassuring and he has not complained of chest pain. Blood pressure gradually improving after clonidine 0.1 mg.  He had been on metoprolol  in the past and tolerated it well.  Plan will be to have him restart that medication and follow-up with primary care.  Clinical Course as of 05/04/24 2127  Wed May 04, 2024  1735 Troponin T High Sensitivity: <15 [CT]    Clinical Course User Index [CT] Anzleigh Slaven B, FNP     FINAL CLINICAL IMPRESSION(S) / ED DIAGNOSES   Final diagnoses:  Uncontrolled hypertension     Rx / DC Orders   ED Discharge Orders          Ordered    metoprolol  succinate (TOPROL -XL) 50 MG 24 hr tablet  Daily        05/04/24 1831    Ambulatory Referral to Primary Care (Establish Care)        05/04/24 1833             Note:  This document was prepared  using Dragon voice recognition software and may include unintentional dictation errors.   Herlinda Kirk NOVAK, FNP 05/04/24 2127    Dorothyann Drivers, MD 05/05/24 2253

## 2024-05-04 NOTE — ED Triage Notes (Signed)
 Patient states epigastric pain and headaches x 2 days; states he had similar symptoms with last MI.

## 2024-05-04 NOTE — Discharge Instructions (Addendum)
 Start the metoprolol  daily.  Schedule your appointment with primary care  Return to the ER if you develop chest pain.

## 2024-05-04 NOTE — ED Notes (Signed)
 Patient transported to CT

## 2024-05-04 NOTE — ED Triage Notes (Signed)
 Arrived by [facility] from fire station for CP.   EMS gave 324 aspirin  20G right forearm  EMS vitals: 157/99 b/p 105HR 100% A&O x4 and ambulatory   History MI 2018 and stents placed

## 2024-05-30 DIAGNOSIS — Z0000 Encounter for general adult medical examination without abnormal findings: Secondary | ICD-10-CM | POA: Diagnosis not present

## 2024-06-06 DIAGNOSIS — I25119 Atherosclerotic heart disease of native coronary artery with unspecified angina pectoris: Secondary | ICD-10-CM | POA: Diagnosis not present
